# Patient Record
Sex: Male | Born: 1995 | Race: White | Hispanic: No | Marital: Single | Smoking: Current every day smoker
Health system: Southern US, Community
[De-identification: ages and names within clinical notes are randomized; demographics above are authoritative.]

## PROBLEM LIST (undated history)

## (undated) DIAGNOSIS — F419 Anxiety disorder, unspecified: Secondary | ICD-10-CM

## (undated) DIAGNOSIS — F1910 Other psychoactive substance abuse, uncomplicated: Secondary | ICD-10-CM

## (undated) DIAGNOSIS — J45909 Unspecified asthma, uncomplicated: Secondary | ICD-10-CM

## (undated) DIAGNOSIS — D689 Coagulation defect, unspecified: Secondary | ICD-10-CM

## (undated) HISTORY — PX: NO PAST SURGERIES: SHX2092

## (undated) HISTORY — DX: Coagulation defect, unspecified: D68.9

## (undated) HISTORY — DX: Other psychoactive substance abuse, uncomplicated: F19.10

---
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
## 2016-01-17 ENCOUNTER — Ambulatory Visit (INDEPENDENT_AMBULATORY_CARE_PROVIDER_SITE_OTHER): Payer: 59 | Admitting: Internal Medicine

## 2016-01-17 ENCOUNTER — Encounter: Payer: Self-pay | Admitting: Internal Medicine

## 2016-01-17 VITALS — BP 110/68 | HR 93 | Ht 71.0 in | Wt 131.8 lb

## 2016-01-17 DIAGNOSIS — J4550 Severe persistent asthma, uncomplicated: Secondary | ICD-10-CM | POA: Diagnosis not present

## 2016-01-17 DIAGNOSIS — J45909 Unspecified asthma, uncomplicated: Secondary | ICD-10-CM | POA: Insufficient documentation

## 2016-01-17 MED ORDER — MOMETASONE FURO-FORMOTEROL FUM 200-5 MCG/ACT IN AERO: 2.0000 | INHALATION_SPRAY | Freq: Two times a day (BID) | RESPIRATORY_TRACT | Status: DC

## 2016-01-17 NOTE — Patient Instructions (Signed)
Continue dulera twice daily Albuterol as needed STOP SMOKING ASAP  Asthma Attack Prevention While you may not be able to control the fact that you have asthma, you can take actions to prevent asthma attacks. The best way to prevent asthma attacks is to maintain good control of your asthma. You can achieve this by:  Taking your medicines as directed.  Avoiding things that can irritate your airways or make your asthma symptoms worse (asthma triggers).  Keeping track of how well your asthma is controlled and of any changes in your symptoms.  Responding quickly to worsening asthma symptoms (asthma attack).  Seeking emergency care when it is needed. WHAT ARE SOME WAYS TO PREVENT AN ASTHMA ATTACK? Have a Plan Work with your health care provider to create a written plan for managing and treating your asthma attacks (asthma action plan). This plan includes:  A list of your asthma triggers and how you can avoid them.  Information on when medicines should be taken and when their dosages should be changed.  The use of a device that measures how well your lungs are working (peak flow meter). Monitor Your Asthma Use your peak flow meter and record your results in a journal every day. A drop in your peak flow numbers on one or more days may indicate the start of an asthma attack. This can happen even before you start to feel symptoms. You can prevent an asthma attack from getting worse by following the steps in your asthma action plan. Avoid Asthma Triggers Work with your asthma health care provider to find out what your asthma triggers are. This can be done by:  Allergy testing.  Keeping a journal that notes when asthma attacks occur and the factors that may have contributed to them.  Determining if there are other medical conditions that are making your asthma worse. Once you have determined your asthma triggers, take steps to avoid them. This may include avoiding excessive or prolonged  exposure to:  Dust. Have someone dust and vacuum your home for you once or twice a week. Using a high-efficiency particulate arrestance (HEPA) vacuum is best.  Smoke. This includes campfire smoke, forest fire smoke, and secondhand smoke from tobacco products.  Pet dander. Avoid contact with animals that you know you are allergic to.  Allergens from trees, grasses or pollens. Avoid spending a lot of time outdoors when pollen counts are high, and on very windy days.  Very cold, dry, or humid air.  Mold.  Foods that contain high amounts of sulfites.  Strong odors.  Outdoor air pollutants, such as Museum/gallery exhibitions officerengine exhaust.  Indoor air pollutants, such as aerosol sprays and fumes from household cleaners.  Household pests, including dust mites and cockroaches, and pest droppings.  Certain medicines, including NSAIDs. Always talk to your health care provider before stopping or starting any new medicines. Medicines Take over-the-counter and prescription medicines only as told by your health care provider. Many asthma attacks can be prevented by carefully following your medicine schedule. Taking your medicines correctly is especially important when you cannot avoid certain asthma triggers. Act Quickly If an asthma attack does happen, acting quickly can decrease how severe it is and how long it lasts. Take these steps:   Pay attention to your symptoms. If you are coughing, wheezing, or having difficulty breathing, do not wait to see if your symptoms go away on their own. Follow your asthma action plan.  If you have followed your asthma action plan and your symptoms are not  improving, call your health care provider or seek immediate medical care at the nearest hospital. It is important to note how often you need to use your fast-acting rescue inhaler. If you are using your rescue inhaler more often, it may mean that your asthma is not under control. Adjusting your asthma treatment plan may help you to  prevent future asthma attacks and help you to gain better control of your condition. HOW CAN I PREVENT AN ASTHMA ATTACK WHEN I EXERCISE? Follow advice from your health care provider about whether you should use your fast-acting inhaler before exercising. Many people with asthma experience exercise-induced bronchoconstriction (EIB). This condition often worsens during vigorous exercise in cold, humid, or dry environments. Usually, people with EIB can stay very active by pre-treating with a fast-acting inhaler before exercising.   This information is not intended to replace advice given to you by your health care provider. Make sure you discuss any questions you have with your health care provider.   Document Released: 06/05/2009 Document Revised: 03/08/2015 Document Reviewed: 11/17/2014 Elsevier Interactive Patient Education [employer]

## 2016-01-17 NOTE — Progress Notes (Signed)
*   [facility]     Assessment and Plan: 20 yo white male with persistent ASTHMA due to polysubstance abuse and ongoing tobacco abuse  Severe asthma-persisitant - likely due to a combination of influenza, smoking, drug abuse. - discussed the importance of smoking cessation -will need to continue him on Dulera 2 puffs twice daily   Nicotine abuse. -We discussed the importance of smoking cessation to his overall health.  Drug abuse. -Discussed the importance of stopping drug abuse, I did not name drugs involved in his polysubstance abuse.  Date: 01/17/2016  MRN# [id number] George Becker January 07, 1996   George Becker is a 20 y.o. old male seen in follow up for chief complaint of  Chief Complaint  Patient presents with  . Follow-up    breathing has improved. no new concerns.      HPI:   Has been using albuterol 10 times per week, still smokes Noncompliant with dulera  No signs of infection at this time     Medication:   Outpatient Encounter Prescriptions as of 01/17/2016  Medication Sig  . albuterol (PROVENTIL HFA;VENTOLIN HFA) 108 (90 Base) MCG/ACT inhaler Inhale 2 puffs into the lungs every 6 (six) hours as needed for wheezing or shortness of breath.  . fexofenadine (ALLEGRA ALLERGY) 180 MG tablet Take 180 mg by mouth daily.  . mometasone-formoterol (DULERA) 200-5 MCG/ACT AERO Inhale 2 puffs into the lungs 2 (two) times daily.  . sertraline (ZOLOFT) 100 MG tablet Take 150 mg by mouth daily.  . [DISCONTINUED] lisdexamfetamine (VYVANSE) 60 MG capsule Take 60 mg by mouth every morning. Reported on 01/17/2016   No facility-administered encounter medications on file as of 01/17/2016.     Allergies:  Amoxicillin and Augmentin  Review of Systems: Gen:  Denies  fever, sweats. HEENT: Denies blurred vision. Cvc:  No dizziness, chest pain or heaviness Resp:   Denies cough or sputum porduction. Gi: Denies swallowing difficulty, stomach pain. constipation,  bowel incontinence Gu:  Denies bladder incontinence, burning urine Ext:   No Joint pain, stiffness. Skin: No skin rash, easy bruising. Endoc:  No polyuria, polydipsia. Psych: No depression, insomnia. Other:  All other systems were reviewed and found to be negative other than what is mentioned in the HPI.   Physical Examination:   VS: BP 110/68 mmHg  Pulse 93  Ht 5\' 11"  (1.803 m)  Wt 131 lb 12.8 oz (59.784 kg)  BMI 18.39 kg/m2  SpO2 96%  General Appearance: No distress  Neuro:without focal findings,  speech normal,  HEENT: PERRLA, EOM intact. Pulmonary: normal breath sounds, No wheezing.   CardiovascularNormal S1,S2.  No m/r/g.   Abdomen: Benign, Soft, non-tender. Renal:  No costovertebral tenderness  GU:  Not performed at this time. Endoc: No evident thyromegaly, no signs of acromegaly. Skin:   warm, no rash. Extremities: normal, no cyanosis, clubbing.  Follow up in 3 months  The Patient requires high complexity decision making for assessment and support, frequent evaluation and titration of therapies.  Patient/Family are satisfied with Plan of action and management. All questions answered   Lucie LeatherKurian David Idonia Zollinger, M.D.  [facility]  Medical Director [facility] Medical Director [facility]

## 2016-02-26 ENCOUNTER — Ambulatory Visit (HOSPITAL_COMMUNITY): Payer: Managed Care, Other (non HMO) | Admitting: Psychiatry

## 2016-04-12 ENCOUNTER — Ambulatory Visit (HOSPITAL_COMMUNITY): Payer: 59 | Admitting: Psychiatry

## 2016-06-06 ENCOUNTER — Ambulatory Visit (INDEPENDENT_AMBULATORY_CARE_PROVIDER_SITE_OTHER): Payer: 59 | Admitting: Internal Medicine

## 2016-06-06 ENCOUNTER — Encounter: Payer: Self-pay | Admitting: Internal Medicine

## 2016-06-06 VITALS — BP 118/68 | HR 69 | Ht 71.0 in | Wt 155.6 lb

## 2016-06-06 DIAGNOSIS — J4550 Severe persistent asthma, uncomplicated: Secondary | ICD-10-CM | POA: Diagnosis not present

## 2016-06-06 MED ORDER — FLUTICASONE-SALMETEROL 250-50 MCG/DOSE IN AEPB: 1.0000 | INHALATION_SPRAY | Freq: Two times a day (BID) | RESPIRATORY_TRACT | 0 refills | Status: DC

## 2016-06-06 MED ORDER — PREDNISONE 20 MG PO TABS: 20.0000 mg | ORAL_TABLET | Freq: Every day | ORAL | 1 refills | Status: DC

## 2016-06-06 NOTE — Patient Instructions (Signed)
START ADVAIR ALBUTEROL AS NEEDED SMOKING CESSATION STRONGLY ADVISED

## 2016-06-06 NOTE — Progress Notes (Signed)
*   [facility]   Date: 06/06/2016  MRN# [id number] Karoline Caldwellustin Slaugh 1996/05/31   Karoline Caldwellustin Dedeaux is a 20 y.o. old male seen in follow up for chief complaint of  Chief Complaint  Patient presents with  . Follow-up    43mo f/u. pt had ED visit 05-17-16 was given prednisone. pt c/o sob with exertion, dry cough at times prod with clear to yellowish mucus & wheezing    CC: follow up asthma  HPI:  Follow up ASTHMA Has been using albuterol 10 times per week, still smokes Noncompliant with dulera Has persistent wheezing Stopped using drigs  No signs of infection at this time     Medication:   Outpatient Encounter Prescriptions as of 06/06/2016  Medication Sig  . albuterol (PROVENTIL HFA;VENTOLIN HFA) 108 (90 Base) MCG/ACT inhaler Inhale 2 puffs into the lungs every 6 (six) hours as needed for wheezing or shortness of breath.  . fexofenadine (ALLEGRA ALLERGY) 180 MG tablet Take 180 mg by mouth daily.  . sertraline (ZOLOFT) 100 MG tablet Take 150 mg by mouth daily.  . [DISCONTINUED] mometasone-formoterol (DULERA) 200-5 MCG/ACT AERO Inhale 2 puffs into the lungs 2 (two) times daily. (Patient not taking: Reported on 06/06/2016)   No facility-administered encounter medications on file as of 06/06/2016.      Allergies:  Amoxicillin and Augmentin [amoxicillin-pot clavulanate]  Review of Systems: Gen:  Denies  fever, sweats. HEENT: Denies blurred vision. Cvc:  No dizziness, chest pain or heaviness Resp:   Denies cough or sputum porduction. Gi: Denies swallowing difficulty, stomach pain. constipation, bowel incontinence Gu:  Denies bladder incontinence, burning urine Ext:   No Joint pain, stiffness. Skin: No skin rash, easy bruising. Endoc:  No polyuria, polydipsia. Psych: No depression, insomnia. Other:  All other systems were reviewed and found to be negative other than what is mentioned in the HPI.   Physical Examination:   VS: BP 118/68 (BP Location: Left Arm,  Cuff Size: Normal)   Pulse 69   Ht 5\' 11"  (1.803 m)   Wt 155 lb 9.6 oz (70.6 kg)   SpO2 98%   BMI 21.70 kg/m   General Appearance: No distress  Neuro:without focal findings,  speech normal,  HEENT: PERRLA, EOM intact. Pulmonary: normal breath sounds,+wheezing.   CardiovascularNormal S1,S2.  No m/r/g.   Extremities: normal, no cyanosis, clubbing.     Assessment and Plan: 20 yo white male with persistent ASTHMA due to polysubstance abuse and ongoing tobacco abuse Patient with chronic persistent wheezing  Severe asthma-persisitant - discussed the importance of smoking cessation -will need Advair daily -albuterol as needed -prednisone 40 mg daily for 10 days   Nicotine abuse. -We discussed the importance of smoking cessation to his overall health.    Follow up in 3 months  The Patient requires high complexity decision making for assessment and support, frequent evaluation and titration of therapies.  Patient/Family are satisfied with Plan of action and management. All questions answered   Lucie LeatherKurian David Kealey Kemmer, M.D.  [facility]  Medical Director [facility] Medical Director [facility]

## 2016-10-03 ENCOUNTER — Emergency Department (INDEPENDENT_AMBULATORY_CARE_PROVIDER_SITE_OTHER)
Admission: EM | Admit: 2016-10-03 | Discharge: 2016-10-03 | Disposition: A | Discharge status: Home / Self Care | Payer: Managed Care, Other (non HMO) | Source: Home / Self Care | Attending: Family Medicine | Admitting: Family Medicine

## 2016-10-03 ENCOUNTER — Encounter: Payer: Self-pay | Admitting: Emergency Medicine

## 2016-10-03 ENCOUNTER — Telehealth: Payer: Self-pay | Admitting: Emergency Medicine

## 2016-10-03 DIAGNOSIS — R5383 Other fatigue: Secondary | ICD-10-CM | POA: Diagnosis not present

## 2016-10-03 DIAGNOSIS — R112 Nausea with vomiting, unspecified: Secondary | ICD-10-CM

## 2016-10-03 MED ORDER — ONDANSETRON 4 MG PO TBDP
4.0000 mg | ORAL_TABLET | Freq: Once | ORAL | Status: AC
  Administered 2016-10-03: 4 mg via ORAL

## 2016-10-03 MED ORDER — ONDANSETRON 4 MG PO TBDP: ORAL_TABLET | ORAL | 0 refills | Status: DC

## 2016-10-03 NOTE — ED Provider Notes (Signed)
[facility]    CSN: [id number] Arrival date & time: 10/03/16  1920     History   Chief Complaint Chief Complaint  Patient presents with  . Nausea    HPI George Becker is a 21 y.o. male.   Patient reports that he had an episode of nausea/vomiting about 2 weeks ago that resolved.  The symptoms recurred briefly three days ago, again resolved.  Bowel movements have been normal.  No abdominal pain.  He complains of persistent fatigue.  No fevers, chills, and sweats.  No weight loss. He requests STD testing   The history is provided by the patient.    Past Medical History:  Diagnosis Date  . Anxiety   . Asthma     Patient Active Problem List   Diagnosis Date Noted  . Asthma 01/17/2016  . Asthma exacerbation 10/04/2015  . Polysubstance abuse 10/04/2015    Past Surgical History:  Procedure Laterality Date  . NO PAST SURGERIES         Home Medications    Prior to Admission medications   Medication Sig Start Date End Date Taking? Authorizing Provider  albuterol (PROVENTIL HFA;VENTOLIN HFA) 108 (90 Base) MCG/ACT inhaler Inhale 2 puffs into the lungs every 6 (six) hours as needed for wheezing or shortness of breath. 10/05/15   Enedina Finner, MD  fexofenadine [facility] ALLERGY) 180 MG tablet Take 180 mg by mouth daily.    Historical Provider, MD  Fluticasone-Salmeterol (ADVAIR DISKUS) 250-50 MCG/DOSE AEPB Inhale 1 puff into the lungs 2 (two) times daily. 06/06/16 06/06/17  Erin Fulling, MD  ondansetron (ZOFRAN ODT) 4 MG disintegrating tablet Take one tab by mouth Q6hr prn nausea.  Dissolve under tongue. 10/03/16   Lattie Haw, MD    Family History Family History  Problem Relation Age of Onset  . Diabetes Mother   . Hypertension Father   . Asthma Maternal Grandfather     Social History Social History  Substance Use Topics  . Smoking status: Current Every Day Smoker    Packs/day: 1.00    Types: Cigarettes  . Smokeless tobacco: Never Used  . Alcohol use  0.0 oz/week     Allergies   Amoxicillin and Augmentin [amoxicillin-pot clavulanate]   Review of Systems Review of Systems No sore throat No cough No pleuritic pain No wheezing No nasal congestion No post-nasal drainage No sinus pain/pressure No itchy/red eyes No earache No hemoptysis No SOB No fever/chills + nausea + vomiting (resolved) No abdominal pain No diarrhea No urinary symptoms No skin rash + fatigue No myalgias No headache Used OTC meds without relief   Physical Exam Triage Vital Signs ED Triage Vitals [10/03/16 2001]  Enc Vitals Group     BP 107/69     Pulse Rate 82     Resp      Temp 97.9 F (36.6 C)     Temp Source Oral     SpO2 97 %     Weight 148 lb (67.1 kg)     Height  (1.803 m)     Head Circumference      Peak Flow      Pain Score 0     Pain Loc      Pain Edu?      Excl. in GC?    No data found.   Updated Vital Signs BP 107/69 (BP Location: Left Arm)   Pulse 82   Temp 97.9 F (36.6 C) (Oral)   Ht  (1.803  m)   Wt 148 lb (67.1 kg)   SpO2 97%   BMI 20.64 kg/m   Visual Acuity Right Eye Distance:   Left Eye Distance:   Bilateral Distance:    Right Eye Near:   Left Eye Near:    Bilateral Near:     Physical Exam Nursing notes and Vital Signs reviewed. Appearance:  Patient appears stated age, and in no acute distress Eyes:  Pupils are equal, round, and reactive to light and accomodation.  Extraocular movement is intact.  Conjunctivae are not inflamed  Ears:  Canals normal.  Tympanic membranes normal.  Nose: Normal turbinates.  No sinus tenderness.   Pharynx:  Normal Neck:  Supple.  No adenopathy or thyromegaly. Lungs:  Clear to auscultation.  Breath sounds are equal.  Moving air well. Heart:  Regular rate and rhythm without murmurs, rubs, or gallops.  Abdomen:  Nontender without masses or hepatosplenomegaly.  Bowel sounds are present.  No CVA or flank tenderness.  Extremities:  No edema.  Skin:  No rash  present.    UC Treatments / Results  Labs (all labs ordered are listed, but only abnormal results are displayed) Labs Reviewed  GC/CHLAMYDIA PROBE AMP  CBC WITH DIFFERENTIAL/PLATELET  COMPLETE METABOLIC PANEL WITH GFR  TSH  HIV ANTIBODY (ROUTINE TESTING)  RPR  HSV(HERPES SIMPLEX VRS) I + II AB-IGG    EKG  EKG Interpretation None       Radiology No results found.  Procedures Procedures (including critical care time)  Medications Ordered in UC Medications - No data to display   Initial Impression / Assessment and Plan / UC Course  I have reviewed the triage vital signs and the nursing notes.  Pertinent labs & imaging results that were available during my care of the patient were reviewed by me and considered in my medical decision making (see chart for details).    Administered Zofran ODT  PO; given Rx for same. CMP, CBC, TSH pending Screen for STD at patient's request. Followup with Family Doctor.   Final Clinical Impressions(s) / UC Diagnoses   Final diagnoses:  Other fatigue  Non-intractable vomiting with nausea, unspecified vomiting type    New Prescriptions New Prescriptions   ONDANSETRON (ZOFRAN ODT) 4 MG DISINTEGRATING TABLET    Take one tab by mouth Q6hr prn nausea.  Dissolve under tongue.     Lattie Haw, MD 10/07/16 [phone]

## 2016-10-03 NOTE — Telephone Encounter (Signed)
Cheyne will be traveling next week and has given permission to give lab results and any medical information to his mother

## 2016-10-03 NOTE — ED Triage Notes (Signed)
Nausea, vomiting, chills x 3 days, currently resolved

## 2016-10-04 LAB — CBC WITH DIFFERENTIAL/PLATELET
Basophils Absolute: 0 cells/uL (ref 0–200)
Basophils Relative: 0 %
Eosinophils Absolute: 570 cells/uL — ABNORMAL HIGH (ref 15–500)
Eosinophils Relative: 6 %
HEMATOCRIT: 44.5 % (ref 38.5–50.0)
Hemoglobin: 15.1 g/dL (ref 13.2–17.1)
LYMPHS PCT: 23 %
Lymphs Abs: 2185 cells/uL (ref 850–3900)
MCH: 29.7 pg (ref 27.0–33.0)
MCHC: 33.9 g/dL (ref 32.0–36.0)
MCV: 87.6 fL (ref 80.0–100.0)
MONO ABS: 475 {cells}/uL (ref 200–950)
MPV: 9.4 fL (ref 7.5–12.5)
Monocytes Relative: 5 %
NEUTROS PCT: 66 %
Neutro Abs: 6270 cells/uL (ref 1500–7800)
Platelets: 232 10*3/uL (ref 140–400)
RBC: 5.08 MIL/uL (ref 4.20–5.80)
RDW: 13.6 % (ref 11.0–15.0)
WBC: 9.5 10*3/uL (ref 3.8–10.8)

## 2016-10-04 LAB — COMPLETE METABOLIC PANEL WITH GFR
ALT: 8 U/L (ref 8–46)
AST: 11 U/L — ABNORMAL LOW (ref 12–32)
Albumin: 4.7 g/dL (ref 3.6–5.1)
Alkaline Phosphatase: 45 U/L — ABNORMAL LOW (ref 48–230)
BUN: 8 mg/dL (ref 7–20)
CALCIUM: 9.2 mg/dL (ref 8.9–10.4)
CO2: 27 mmol/L (ref 20–31)
Chloride: 103 mmol/L (ref 98–110)
Creat: 0.67 mg/dL (ref 0.60–1.26)
GFR, Est Non African American: >89 mL/min (ref >=60)
Glucose, Bld: 93 mg/dL (ref 65–99)
POTASSIUM: 3.7 mmol/L — AB (ref 3.8–5.1)
Sodium: 140 mmol/L (ref 135–146)
Total Bilirubin: 0.7 mg/dL (ref 0.2–1.1)
Total Protein: 7.1 g/dL (ref 6.3–8.2)

## 2016-10-04 LAB — TSH: TSH: 0.93 m[IU]/L (ref 0.50–4.30)

## 2016-10-04 LAB — HIV ANTIBODY (ROUTINE TESTING W REFLEX): HIV: NONREACTIVE

## 2016-10-05 LAB — RPR

## 2016-10-05 LAB — GC/CHLAMYDIA PROBE AMP
CT Probe RNA: NOT DETECTED
GC Probe RNA: NOT DETECTED

## 2016-10-07 ENCOUNTER — Telehealth: Payer: Self-pay | Admitting: *Deleted

## 2016-10-07 ENCOUNTER — Telehealth: Payer: Self-pay | Admitting: Emergency Medicine

## 2016-10-07 LAB — HSV(HERPES SIMPLEX VRS) I + II AB-IGG: HSV 1 GLYCOPROTEIN G AB, IGG: 25.7 {index} — AB (ref <0.90)

## 2016-10-07 NOTE — Telephone Encounter (Signed)
Spoke to pt's mother given lab results per pt's request.

## 2017-03-15 ENCOUNTER — Emergency Department (HOSPITAL_COMMUNITY)
Admission: EM | Admit: 2017-03-15 | Discharge: 2017-03-17 | Disposition: A | Discharge status: Home / Self Care | Payer: Managed Care, Other (non HMO) | Attending: Emergency Medicine | Admitting: Emergency Medicine

## 2017-03-15 ENCOUNTER — Encounter (HOSPITAL_COMMUNITY): Payer: Self-pay | Admitting: Emergency Medicine

## 2017-03-15 DIAGNOSIS — Z593 Problems related to living in residential institution: Secondary | ICD-10-CM | POA: Diagnosis not present

## 2017-03-15 DIAGNOSIS — Z79899 Other long term (current) drug therapy: Secondary | ICD-10-CM | POA: Insufficient documentation

## 2017-03-15 DIAGNOSIS — F1910 Other psychoactive substance abuse, uncomplicated: Secondary | ICD-10-CM | POA: Diagnosis present

## 2017-03-15 DIAGNOSIS — Z720 Tobacco use: Secondary | ICD-10-CM

## 2017-03-15 DIAGNOSIS — E876 Hypokalemia: Secondary | ICD-10-CM

## 2017-03-15 DIAGNOSIS — R45851 Suicidal ideations: Secondary | ICD-10-CM

## 2017-03-15 DIAGNOSIS — F1994 Other psychoactive substance use, unspecified with psychoactive substance-induced mood disorder: Secondary | ICD-10-CM | POA: Diagnosis present

## 2017-03-15 DIAGNOSIS — F17210 Nicotine dependence, cigarettes, uncomplicated: Secondary | ICD-10-CM | POA: Diagnosis not present

## 2017-03-15 DIAGNOSIS — Z008 Encounter for other general examination: Secondary | ICD-10-CM

## 2017-03-15 DIAGNOSIS — G4700 Insomnia, unspecified: Secondary | ICD-10-CM

## 2017-03-15 DIAGNOSIS — R443 Hallucinations, unspecified: Secondary | ICD-10-CM | POA: Insufficient documentation

## 2017-03-15 DIAGNOSIS — J4520 Mild intermittent asthma, uncomplicated: Secondary | ICD-10-CM | POA: Diagnosis not present

## 2017-03-15 LAB — CBC
HCT: 45.3 % (ref 39.0–52.0)
Hemoglobin: 16.3 g/dL (ref 13.0–17.0)
MCH: 30.8 pg (ref 26.0–34.0)
MCHC: 36 g/dL (ref 30.0–36.0)
MCV: 85.6 fL (ref 78.0–100.0)
PLATELETS: 265 10*3/uL (ref 150–400)
RBC: 5.29 MIL/uL (ref 4.22–5.81)
RDW: 12.6 % (ref 11.5–15.5)
WBC: 8.3 10*3/uL (ref 4.0–10.5)

## 2017-03-15 LAB — RAPID URINE DRUG SCREEN, HOSP PERFORMED
AMPHETAMINES: POSITIVE — AB
BENZODIAZEPINES: NOT DETECTED
Barbiturates: NOT DETECTED
COCAINE: NOT DETECTED
OPIATES: POSITIVE — AB
Tetrahydrocannabinol: POSITIVE — AB

## 2017-03-15 LAB — COMPREHENSIVE METABOLIC PANEL
ALK PHOS: 45 U/L (ref 38–126)
ALT: 16 U/L — AB (ref 17–63)
AST: 19 U/L (ref 15–41)
Albumin: 4.9 g/dL (ref 3.5–5.0)
Anion gap: 15 (ref 5–15)
BILIRUBIN TOTAL: 1.6 mg/dL — AB (ref 0.3–1.2)
BUN: 11 mg/dL (ref 6–20)
CO2: 28 mmol/L (ref 22–32)
CREATININE: 0.77 mg/dL (ref 0.61–1.24)
Calcium: 9.3 mg/dL (ref 8.9–10.3)
Chloride: 95 mmol/L — ABNORMAL LOW (ref 101–111)
GFR calc Af Amer: >60 mL/min (ref >60)
Glucose, Bld: 91 mg/dL (ref 65–99)
Potassium: 3.3 mmol/L — ABNORMAL LOW (ref 3.5–5.1)
Sodium: 138 mmol/L (ref 135–145)
TOTAL PROTEIN: 7.9 g/dL (ref 6.5–8.1)

## 2017-03-15 LAB — ETHANOL

## 2017-03-15 LAB — SALICYLATE LEVEL: Salicylate Lvl: <7 mg/dL (ref 2.8–30.0)

## 2017-03-15 LAB — ACETAMINOPHEN LEVEL: Acetaminophen (Tylenol), Serum: <10 ug/mL — ABNORMAL LOW (ref 10–30)

## 2017-03-15 NOTE — ED Notes (Signed)
MD notified of patient in triage 3

## 2017-03-15 NOTE — ED Notes (Signed)
Patient gave mother permission to get info George Becker [phone].

## 2017-03-15 NOTE — ED Provider Notes (Signed)
[facility]-EMER[facility]ENCY DEPT Provider Note   CSN: [id number] Arrival date & time: 03/15/17  2214     History   Chief Complaint Chief Complaint  Patient presents with  . Suicidal    HPI George Becker is a 21 y.o. male with a PMHx of asthma, anxiety, and polysubstance abuse, who presents to the ED with complaints of suicidal ideations without a plan and polysubstance abuse. Patient states that he was sober until about one month ago when he relapsed. He uses multiple drugs, but most consistently smokes meth and uses heroin. He used heroin just prior to arrival today, and his last meth use was at 2 AM this morning. He occasionally uses marijuana but hasn't had any recently. He is very upset that he relapsed, and this is contributing to his suicidal thoughts. He reports body aches and nausea due to heroin use, as well as insomnia stating that he has not slept in 6 days. He is having visual hallucinations seeing bubbling at the ceiling tiles, and having auditory hallucinations hearing whispers but denies any command voices. Denies EtOH use or HI. +Smoker. Reports having asthma, has had some wheezing today, but his inhaler helped significantly in the evaluation room. Sees Dr. Daleen Squibb in [place] for his psychiatric care. Reports being rx'd zoloft  daily but hasn't taken it in about 3 weeks. Denies any other complaints at this time. Here voluntarily.    The history is provided by the patient and medical records. No language interpreter was used.  Mental Health Problem  Presenting symptoms: depression, hallucinations and suicidal thoughts   Patient accompanied by:  Family member Onset quality:  Gradual Duration:  1 month Timing:  Constant Progression:  Worsening Chronicity:  Recurrent Context: drug abuse and noncompliance   Treatment compliance:  Some of the time Time since last psychoactive medication taken:  3 weeks Relieved by:  None tried Worsened by:  Drugs Ineffective treatments:  None  tried Associated symptoms: no abdominal pain and no chest pain   Risk factors: hx of mental illness     Past Medical History:  Diagnosis Date  . Anxiety   . Asthma     Patient Active Problem List   Diagnosis Date Noted  . Asthma 01/17/2016  . Asthma exacerbation 10/04/2015  . Polysubstance abuse 10/04/2015    Past Surgical History:  Procedure Laterality Date  . NO PAST SURGERIES         Home Medications    Prior to Admission medications   Medication Sig Start Date End Date Taking? Authorizing Provider  albuterol (PROVENTIL HFA;VENTOLIN HFA) 108 (90 Base) MCG/ACT inhaler Inhale 2 puffs into the lungs every 6 (six) hours as needed for wheezing or shortness of breath. 10/05/15   Enedina Finner, MD  fexofenadine [facility] ALLERGY) 180 MG tablet Take 180 mg by mouth daily.    [provider]  Fluticasone-Salmeterol (ADVAIR DISKUS) 250-50 MCG/DOSE AEPB Inhale 1 puff into the lungs 2 (two) times daily. 06/06/16 06/06/17  Erin Fulling, MD  ondansetron (ZOFRAN ODT) 4 MG disintegrating tablet Take one tab by mouth Q6hr prn nausea.  Dissolve under tongue. 10/03/16   Lattie Haw, MD    Family History Family History  Problem Relation Age of Onset  . Diabetes Mother   . Hypertension Father   . Asthma Maternal Grandfather     Social History Social History  Substance Use Topics  . Smoking status: Current Every Day Smoker    Packs/day: 1.00    Types: Cigarettes  . Smokeless tobacco:  Never Used  . Alcohol use 0.0 oz/week     Allergies   Amoxicillin and Augmentin [amoxicillin-pot clavulanate]   Review of Systems Review of Systems  Constitutional: Negative for chills and fever.  Respiratory: Positive for wheezing. Negative for shortness of breath.   Cardiovascular: Negative for chest pain.  Gastrointestinal: Positive for nausea. Negative for abdominal pain, constipation, diarrhea and vomiting.  Genitourinary: Negative for dysuria and hematuria.  Musculoskeletal:  Positive for myalgias (body aches). Negative for arthralgias.  Skin: Negative for color change.  Allergic/Immunologic: Negative for immunocompromised state.  Neurological: Negative for weakness and numbness.  Psychiatric/Behavioral: Positive for hallucinations, sleep disturbance and suicidal ideas. Negative for confusion.   All other systems reviewed and are negative for acute change except as noted in the HPI.    Physical Exam Updated Vital Signs BP 129/82 (BP Location: Left Arm)   Pulse (!) 105   Temp (!) 97.5 F (36.4 C) (Oral)   Resp 18   Ht  (1.778 m)   Wt 67.1 kg (148 lb)   SpO2 95%   BMI 21.24 kg/m   Physical Exam  Constitutional: He is oriented to person, place, and time. Vital signs are normal. He appears well-developed and well-nourished.  Non-toxic appearance. No distress.  Afebrile, nontoxic, NAD  HENT:  Head: Normocephalic and atraumatic.  Mouth/Throat: Oropharynx is clear and moist and mucous membranes are normal.  Eyes: Conjunctivae and EOM are normal. Right eye exhibits no discharge. Left eye exhibits no discharge.  Neck: Normal range of motion. Neck supple.  Cardiovascular: Normal rate, regular rhythm, normal heart sounds and intact distal pulses.  Exam reveals no gallop and no friction rub.   No murmur heard. Pulmonary/Chest: Effort normal and breath sounds normal. No respiratory distress. He has no decreased breath sounds. He has no wheezes. He has no rhonchi. He has no rales.  Initially with some expiratory wheezing, but after albuterol CTAB in all lung fields, no w/r/r, no hypoxia or increased WOB, speaking in full sentences, SpO2 95% on RA   Abdominal: Soft. Normal appearance and bowel sounds are normal. He exhibits no distension. There is no tenderness. There is no rigidity, no rebound, no guarding, no CVA tenderness, no tenderness at McBurney's point and negative Murphy's sign.  Musculoskeletal: Normal range of motion.  Neurological: He is alert and  oriented to person, place, and time. He has normal strength. No sensory deficit.  Skin: Skin is warm, dry and intact. No rash noted.  Psychiatric: He is actively hallucinating. He exhibits a depressed mood. He expresses suicidal ideation. He expresses no homicidal ideation. He expresses no suicidal plans and no homicidal plans.  Slightly depressed affect, tearful at times, but pleasant and cooperative. Endorsing SI without a plan, denies HI, reports AVH, but doesn't seem to be responding to internal stimuli.   Nursing note and vitals reviewed.    ED Treatments / Results  Labs (all labs ordered are listed, but only abnormal results are displayed) Labs Reviewed  COMPREHENSIVE METABOLIC PANEL - Abnormal; Notable for the following:       Result Value   Potassium 3.3 (*)    Chloride 95 (*)    ALT 16 (*)    Total Bilirubin 1.6 (*)    All other components within normal limits  ACETAMINOPHEN LEVEL - Abnormal; Notable for the following:    Acetaminophen (Tylenol), Serum <10 (*)    All other components within normal limits  RAPID URINE DRUG SCREEN, HOSP PERFORMED - Abnormal; Notable for the  following:    Opiates POSITIVE (*)    Amphetamines POSITIVE (*)    Tetrahydrocannabinol POSITIVE (*)    All other components within normal limits  ETHANOL  SALICYLATE LEVEL  CBC    EKG  EKG Interpretation None       Radiology No results found.  Procedures Procedures (including critical care time)  Medications Ordered in ED Medications  loratadine (CLARITIN) tablet 10 mg (not administered)  fluticasone furoate-vilanterol (BREO ELLIPTA) 200-25 MCG/INH 1 puff (not administered)  albuterol (PROVENTIL HFA;VENTOLIN HFA) 108 (90 Base) MCG/ACT inhaler 2 puff (not administered)  sertraline (ZOLOFT) tablet 100 mg (not administered)  triamcinolone cream (KENALOG) 0.1 % (not administered)  valACYclovir (VALTREX) tablet 1,000 mg (not administered)  potassium chloride SA (K-DUR,KLOR-CON) CR tablet 40  mEq (not administered)  acetaminophen (TYLENOL) tablet 650 mg (not administered)  zolpidem (AMBIEN) tablet 5 mg (not administered)  ondansetron (ZOFRAN) tablet 4 mg (not administered)  alum & mag hydroxide-simeth (MAALOX/MYLANTA) 200-200-20 MG/5ML suspension 30 mL (not administered)  nicotine (NICODERM CQ - dosed in mg/24 hours) patch 21 mg (not administered)     Initial Impression / Assessment and Plan / ED Course  I have reviewed the triage vital signs and the nursing notes.  Pertinent labs & imaging results that were available during my care of the patient were reviewed by me and considered in my medical decision making (see chart for details).     21 y.o. male here with SI without a plan, and polysubstance abuse. Reports insomnia and hallucinations related to drug use. +Smoker, cessation strongly encouraged. Denies HI/EtOH. Only other complaints is nausea and body aches from heroin use. Also has wheeze from asthma, used inhaler in room and this significantly improved. Noncompliant with zoloft x3wks. On exam, tearful but cooperative and very pleasant, lungs clear after albuterol, depressed affect. Labs with mildly low K 3.3, mildly elevated bili 1.6 likely from dehydration, UDS with +opiates +amphetamines +THC but otherwise work up unremarkable. Pt medically cleared at this time. Psych hold orders and home med orders placed. Please see TTS notes for further documentation of care/dispo. PLEASE NOTE THAT PT IS HERE VOLUNTARILY AT THIS TIME, IF PT TRIES TO LEAVE THEY WOULD NEED IVC PAPERWORK TAKEN OUT. Pt stable at time of med clearance.     Final Clinical Impressions(s) / ED Diagnoses   Final diagnoses:  Suicidal ideation  Polysubstance abuse  Tobacco user  Hallucinations  Insomnia, unspecified type  Mild intermittent asthma without complication  Medical clearance for psychiatric admission  Hypokalemia    New Prescriptions New Prescriptions   No medications on [address]  [place], [place] 03/16/17 Mal Misty, April, MD 03/16/17 [phone]

## 2017-03-15 NOTE — ED Notes (Signed)
Bed: [facility] Expected date:  Expected time:  Means of arrival:  Comments: 
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
## 2017-03-16 DIAGNOSIS — Z593 Problems related to living in residential institution: Secondary | ICD-10-CM

## 2017-03-16 DIAGNOSIS — F17210 Nicotine dependence, cigarettes, uncomplicated: Secondary | ICD-10-CM | POA: Diagnosis not present

## 2017-03-16 DIAGNOSIS — F1994 Other psychoactive substance use, unspecified with psychoactive substance-induced mood disorder: Secondary | ICD-10-CM | POA: Diagnosis not present

## 2017-03-16 DIAGNOSIS — R443 Hallucinations, unspecified: Secondary | ICD-10-CM

## 2017-03-16 DIAGNOSIS — F1910 Other psychoactive substance abuse, uncomplicated: Secondary | ICD-10-CM | POA: Diagnosis not present

## 2017-03-16 DIAGNOSIS — F419 Anxiety disorder, unspecified: Secondary | ICD-10-CM

## 2017-03-16 DIAGNOSIS — R4587 Impulsiveness: Secondary | ICD-10-CM

## 2017-03-16 DIAGNOSIS — R451 Restlessness and agitation: Secondary | ICD-10-CM

## 2017-03-16 DIAGNOSIS — R45851 Suicidal ideations: Secondary | ICD-10-CM

## 2017-03-16 MED ORDER — ALBUTEROL SULFATE HFA 108 (90 BASE) MCG/ACT IN AERS
2.0000 | INHALATION_SPRAY | RESPIRATORY_TRACT | Status: DC | PRN
  Administered 2017-03-16: 2 via RESPIRATORY_TRACT
  Filled 2017-03-16: qty 6.7

## 2017-03-16 MED ORDER — ONDANSETRON HCL 4 MG PO TABS
4.0000 mg | ORAL_TABLET | Freq: Three times a day (TID) | ORAL | Status: DC | PRN
Start: 2017-03-16 — End: 2017-03-17

## 2017-03-16 MED ORDER — GABAPENTIN 100 MG PO CAPS
200.0000 mg | ORAL_CAPSULE | Freq: Two times a day (BID) | ORAL | Status: DC
  Administered 2017-03-16 – 2017-03-17 (×3): 200 mg via ORAL
  Filled 2017-03-16 (×3): qty 2

## 2017-03-16 MED ORDER — POTASSIUM CHLORIDE CRYS ER 20 MEQ PO TBCR
40.0000 meq | EXTENDED_RELEASE_TABLET | Freq: Once | ORAL | Status: AC
  Administered 2017-03-16: 40 meq via ORAL
  Filled 2017-03-16: qty 2

## 2017-03-16 MED ORDER — SERTRALINE HCL 50 MG PO TABS
100.0000 mg | ORAL_TABLET | Freq: Every day | ORAL | Status: DC
  Administered 2017-03-16: 100 mg via ORAL
  Filled 2017-03-16: qty 2

## 2017-03-16 MED ORDER — TRIAMCINOLONE ACETONIDE 0.1 % EX CREA
TOPICAL_CREAM | Freq: Two times a day (BID) | CUTANEOUS | Status: DC
  Administered 2017-03-16 (×2): via TOPICAL
  Filled 2017-03-16: qty 15

## 2017-03-16 MED ORDER — FLUTICASONE FUROATE-VILANTEROL 200-25 MCG/INH IN AEPB
1.0000 | INHALATION_SPRAY | Freq: Every day | RESPIRATORY_TRACT | Status: DC
  Administered 2017-03-16 – 2017-03-17 (×2): 1 via RESPIRATORY_TRACT
  Filled 2017-03-16: qty 28

## 2017-03-16 MED ORDER — NICOTINE 21 MG/24HR TD PT24
21.0000 mg | MEDICATED_PATCH | Freq: Every day | TRANSDERMAL | Status: DC
  Administered 2017-03-16 – 2017-03-17 (×3): 21 mg via TRANSDERMAL
  Filled 2017-03-16 (×3): qty 1

## 2017-03-16 MED ORDER — VALACYCLOVIR HCL 500 MG PO TABS: 1000.0000 mg | ORAL_TABLET | Freq: Three times a day (TID) | ORAL | Status: DC | PRN

## 2017-03-16 MED ORDER — ALUM & MAG HYDROXIDE-SIMETH 200-200-20 MG/5ML PO SUSP: 30.0000 mL | Freq: Four times a day (QID) | ORAL | Status: DC | PRN

## 2017-03-16 MED ORDER — ZOLPIDEM TARTRATE 5 MG PO TABS
5.0000 mg | ORAL_TABLET | Freq: Every evening | ORAL | Status: DC | PRN
  Administered 2017-03-16: 5 mg via ORAL
  Filled 2017-03-16: qty 1

## 2017-03-16 MED ORDER — ACETAMINOPHEN 325 MG PO TABS: 650.0000 mg | ORAL_TABLET | ORAL | Status: DC | PRN

## 2017-03-16 MED ORDER — LORATADINE 10 MG PO TABS
10.0000 mg | ORAL_TABLET | Freq: Every day | ORAL | Status: DC
  Administered 2017-03-16 – 2017-03-17 (×2): 10 mg via ORAL
  Filled 2017-03-16 (×2): qty 1

## 2017-03-16 MED ORDER — TRAZODONE HCL 50 MG PO TABS
50.0000 mg | ORAL_TABLET | Freq: Every day | ORAL | Status: DC
  Administered 2017-03-16: 50 mg via ORAL
  Filled 2017-03-16: qty 1

## 2017-03-16 NOTE — ED Notes (Signed)
Pt presents with SI, after relapsing on drugs.  Mother reports symptoms aggravated by recent death of family member.  A&O x 3, no distress noted, calm & cooperative.  Monitoring for safety, Q 15 min checks in effect.  Safety check for contraband completed, no items found.  TTS counselor Aquicha eval pt at present.

## 2017-03-16 NOTE — ED Notes (Signed)
Patient wanded 
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
## 2017-03-16 NOTE — ED Notes (Signed)
Patient has been using meth and heroin.  He states, "I have addiction problems."  Patient becomes suicidal when he is using and thinks of "walking into traffic or something."  Patient has not slept in 6 days and is very drowsy and difficult to wake.  He is pleasant and is requesting help for his substance abuse.  He met with treatment team and agreed to stay overnight and discuss options with peer support in the am.  Patient denies thoughts of self harm at this time.  He is visiting with his mother who is supportive.

## 2017-03-17 DIAGNOSIS — F1910 Other psychoactive substance abuse, uncomplicated: Secondary | ICD-10-CM | POA: Diagnosis not present

## 2017-03-17 MED ORDER — GABAPENTIN 100 MG PO CAPS: 200.0000 mg | ORAL_CAPSULE | Freq: Two times a day (BID) | ORAL | 0 refills | Status: DC

## 2017-03-17 MED ORDER — TRAZODONE HCL 50 MG PO TABS: 50.0000 mg | ORAL_TABLET | Freq: Every day | ORAL | 0 refills | Status: DC

## 2017-03-17 NOTE — ED Notes (Signed)
Peer Support Specialist at bedside.

## 2017-03-17 NOTE — [facility] Suicide Risk Assessment (Signed)
Suicide Risk Assessment  Discharge Assessment   [facility] Discharge Suicide Risk Assessment   Principal Problem: Substance induced mood disorder [facility]) Discharge Diagnoses:  Patient Active Problem List   Diagnosis Date Noted  . Substance induced mood disorder ([facility]) [F19.94] 03/16/2017  . Asthma [J45.909] 01/17/2016  . Asthma exacerbation [J45.901] 10/04/2015  . Polysubstance abuse [F19.10] 10/04/2015    Total Time spent with patient: 45 minutes  Musculoskeletal: Strength & Muscle Tone: within normal limits Gait & Station: normal Patient leans: N/A  Psychiatric Specialty Exam: Physical Exam  Constitutional: He is oriented to person, place, and time. He appears well-developed and well-nourished.  Respiratory: Effort normal.  Musculoskeletal: Normal range of motion.  Neurological: He is alert and oriented to person, place, and time.  Psychiatric: His speech is normal and behavior is normal. Thought content normal. His mood appears anxious. Cognition and memory are impaired. He expresses impulsivity. He exhibits a depressed mood.   Review of Systems  Psychiatric/Behavioral: Positive for depression and substance abuse. Negative for hallucinations, memory loss and suicidal ideas. The patient is nervous/anxious. The patient does not have insomnia.   All other systems reviewed and are negative.  Blood pressure 122/64, pulse 82, temperature 97.8 F (36.6 C), temperature source Oral, resp. rate 20, height  (1.778 m), weight 67.1 kg (148 lb), SpO2 99 %.Body mass index is 21.24 kg/m. General Appearance: Casual Eye Contact:  Good Speech:  Clear and Coherent and Normal Rate Volume:  Normal Mood:  Anxious and Depressed Affect:  Congruent and Depressed Thought Process:  Coherent, Goal Directed and Linear Orientation:  Full (Time, Place, and Person) Thought Content:  Logical Suicidal Thoughts:  No Homicidal Thoughts:  No Memory:  Immediate;   Good Recent;   Good Remote;    Fair Judgement:  Fair Insight:  Fair Psychomotor Activity:  Normal Concentration:  Concentration: Good and Attention Span: Good Recall:  Good Fund of Knowledge:  Good Language:  Good Akathisia:  No Handed:  Right AIMS (if indicated):    Assets:  Communication Skills Desire for Improvement Financial Resources/Insurance Housing Physical Health Resilience Social Support Vocational/Educational ADL's:  Intact Cognition:  WNL  Mental Status Per Nursing Assessment::   On Admission:   suicidal ideation and "high" on drugs  Demographic Factors:  Male, Caucasian, Gay, lesbian, or bisexual orientation and Unemployed  Loss Factors: Financial problems/change in socioeconomic status  Historical Factors: Impulsivity  Risk Reduction Factors:   Sense of responsibility to family, Living with another person, especially a relative and Positive social support  Continued Clinical Symptoms:  Severe Anxiety and/or Agitation Depression:   Impulsivity Alcohol/Substance Abuse/Dependencies More than one psychiatric diagnosis  Cognitive Features That Contribute To Risk:  Closed-mindedness    Suicide Risk:  Minimal: No identifiable suicidal ideation.  Patients presenting with no risk factors but with morbid ruminations; may be classified as minimal risk based on the severity of the depressive symptoms    Plan Of Care/Follow-up recommendations:  Activity:  as tolerated Diet:  Heart Healthy  Laveda Abbe, NP 03/17/2017, 11:07 AM
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
## 2017-03-17 NOTE — ED Notes (Signed)
Pt D/C home per MD order. Discharge summary reviewed with pt. Pt verbalizes understanding. RX provided. Pt denies SI/HI/AVH. Pt signed e-signature. Ambulatory off unit with MHT.

## 2017-03-17 NOTE — Discharge Instructions (Signed)
Follow up:  [facility] [address], [place] [address]   [phone]  [facility] [address] [place], [place] [address]  Phone: [phone]

## 2017-04-08 ENCOUNTER — Ambulatory Visit (HOSPITAL_COMMUNITY)
Admission: EM | Admit: 2017-04-08 | Discharge: 2017-04-08 | Disposition: A | Discharge status: Home / Self Care | Payer: 59 | Attending: Family Medicine | Admitting: Family Medicine

## 2017-04-08 ENCOUNTER — Emergency Department (HOSPITAL_COMMUNITY): Payer: 59

## 2017-04-08 ENCOUNTER — Encounter (HOSPITAL_COMMUNITY): Payer: Self-pay | Admitting: Emergency Medicine

## 2017-04-08 DIAGNOSIS — Z91010 Allergy to peanuts: Secondary | ICD-10-CM | POA: Insufficient documentation

## 2017-04-08 DIAGNOSIS — Z79899 Other long term (current) drug therapy: Secondary | ICD-10-CM | POA: Diagnosis not present

## 2017-04-08 DIAGNOSIS — R05 Cough: Secondary | ICD-10-CM | POA: Diagnosis present

## 2017-04-08 DIAGNOSIS — J45901 Unspecified asthma with (acute) exacerbation: Secondary | ICD-10-CM | POA: Diagnosis not present

## 2017-04-08 DIAGNOSIS — J4551 Severe persistent asthma with (acute) exacerbation: Secondary | ICD-10-CM | POA: Insufficient documentation

## 2017-04-08 DIAGNOSIS — F17210 Nicotine dependence, cigarettes, uncomplicated: Secondary | ICD-10-CM | POA: Insufficient documentation

## 2017-04-08 LAB — CBC WITH DIFFERENTIAL/PLATELET
Basophils Absolute: 0 10*3/uL (ref 0.0–0.1)
Basophils Relative: 0 %
Eosinophils Absolute: 0.3 10*3/uL (ref 0.0–0.7)
Eosinophils Relative: 5 %
HEMATOCRIT: 44 % (ref 39.0–52.0)
HEMOGLOBIN: 15.5 g/dL (ref 13.0–17.0)
LYMPHS ABS: 0.6 10*3/uL — AB (ref 0.7–4.0)
LYMPHS PCT: 12 %
MCH: 30 pg (ref 26.0–34.0)
MCHC: 35.2 g/dL (ref 30.0–36.0)
MCV: 85.3 fL (ref 78.0–100.0)
MONO ABS: 0.7 10*3/uL (ref 0.1–1.0)
MONOS PCT: 12 %
NEUTROS ABS: 3.9 10*3/uL (ref 1.7–7.7)
NEUTROS PCT: 71 %
Platelets: 189 10*3/uL (ref 150–400)
RBC: 5.16 MIL/uL (ref 4.22–5.81)
RDW: 12.8 % (ref 11.5–15.5)
WBC: 5.4 10*3/uL (ref 4.0–10.5)

## 2017-04-08 MED ORDER — PREDNISONE 10 MG (21) PO TBPK: ORAL_TABLET | Freq: Every day | ORAL | 0 refills | Status: DC

## 2017-04-08 NOTE — ED Triage Notes (Signed)
PT reports tingle in throat last night. PT woke up with cough and SOB. PT used inhaler several times today. PT audibly wheezing. PT reports cough is productive of bright yellow phlegm.

## 2017-04-08 NOTE — ED Triage Notes (Signed)
Pt to ED with c/o productive cough and congestion.  Pt st's he was seen at Urgent Care earlier tonight but not feeling any better.

## 2017-04-09 ENCOUNTER — Emergency Department (HOSPITAL_COMMUNITY)
Admission: EM | Admit: 2017-04-09 | Discharge: 2017-04-09 | Disposition: A | Discharge status: Home / Self Care | Payer: 59 | Attending: Emergency Medicine | Admitting: Emergency Medicine

## 2017-04-09 DIAGNOSIS — J4551 Severe persistent asthma with (acute) exacerbation: Secondary | ICD-10-CM

## 2017-04-09 LAB — COMPREHENSIVE METABOLIC PANEL
ALT: 15 U/L — AB (ref 17–63)
AST: 16 U/L (ref 15–41)
Albumin: 4.6 g/dL (ref 3.5–5.0)
Alkaline Phosphatase: 50 U/L (ref 38–126)
Anion gap: 12 (ref 5–15)
BUN: 10 mg/dL (ref 6–20)
CHLORIDE: 96 mmol/L — AB (ref 101–111)
CO2: 25 mmol/L (ref 22–32)
CREATININE: 0.78 mg/dL (ref 0.61–1.24)
Calcium: 9.3 mg/dL (ref 8.9–10.3)
GFR calc Af Amer: >60 mL/min (ref >60)
GFR calc non Af Amer: >60 mL/min (ref >60)
Glucose, Bld: 119 mg/dL — ABNORMAL HIGH (ref 65–99)
POTASSIUM: 3.5 mmol/L (ref 3.5–5.1)
SODIUM: 133 mmol/L — AB (ref 135–145)
Total Bilirubin: 1.5 mg/dL — ABNORMAL HIGH (ref 0.3–1.2)
Total Protein: 7.2 g/dL (ref 6.5–8.1)

## 2017-04-09 MED ORDER — ALBUTEROL SULFATE (2.5 MG/3ML) 0.083% IN NEBU
5.0000 mg | INHALATION_SOLUTION | Freq: Once | RESPIRATORY_TRACT | Status: AC
  Administered 2017-04-09: 5 mg via RESPIRATORY_TRACT
  Filled 2017-04-09: qty 6

## 2017-04-09 MED ORDER — IPRATROPIUM BROMIDE 0.02 % IN SOLN
0.5000 mg | Freq: Once | RESPIRATORY_TRACT | Status: AC
Start: 2017-04-09 — End: 2017-04-09
  Administered 2017-04-09: 0.5 mg via RESPIRATORY_TRACT
  Filled 2017-04-09: qty 2.5

## 2017-04-09 NOTE — ED Notes (Signed)
Pt ambulated with pulse ox, O2 sats 90-91%, HR 130's. Pt denies SOB. Work of breathing WNL, not labored. EDP aware.

## 2017-04-09 NOTE — Progress Notes (Signed)
Pt states that he has a productive cough for X2 days with sputum production being yellow in color. Pt states that he has history of asthma. BBS are inspiratory and expiratory wheezing to ausculation. Respiratory rate and pattern is normal at this time. No respiratory compromise noted at this time.

## 2017-04-09 NOTE — ED Provider Notes (Signed)
[facility]ENC[facility] DEPT Provider Note   CSN: [id number] Arrival date & time: 04/08/17  2245     History   Chief Complaint Chief Complaint  Patient presents with  . Cough  . Shortness of Breath    HPI George Becker is a 21 y.o. male.  The history is provided by the patient and a parent.  Cough  This is a new problem. The current episode started 2 days ago. The problem occurs every few minutes. The problem has been gradually worsening. The cough is non-productive. There has been no fever. Associated symptoms include shortness of breath and wheezing. He is a smoker. His past medical history is significant for asthma.  Shortness of Breath  Associated symptoms include cough and wheezing. Pertinent negatives include no fever. Associated medical issues include asthma.  patient with h/o asthma presents with increasing cough/sob/wheezing/chest tightness for past 2 days No fever No hemoptysis This is similar to prior episodes of asthma No h/o intubation He went to urgent care, started prednisone, but is not feeling improved Last prednisone dose at 2100 on 04/08/17  No h/o VTE  Past Medical History:  Diagnosis Date  . Anxiety   . Asthma     Patient Active Problem List   Diagnosis Date Noted  . Substance induced mood disorder ([facility]) 03/16/2017  . Asthma 01/17/2016  . Asthma exacerbation 10/04/2015  . Polysubstance abuse ([facility]) 10/04/2015    Past Surgical History:  Procedure Laterality Date  . NO PAST SURGERIES         Home Medications    Prior to Admission medications   Medication Sig Start Date End Date Taking? Authorizing Provider  albuterol (PROVENTIL HFA;VENTOLIN HFA) 108 (90 Base) MCG/ACT inhaler Inhale 2 puffs into the lungs every 6 (six) hours as needed for wheezing or shortness of breath. 10/05/15  Yes Enedina Finner, MD  fexofenadine [facility] ALLERGY) 180 MG tablet Take 180 mg by mouth daily.   Yes [provider]  Fluticasone-Salmeterol (ADVAIR DISKUS) 250-50  MCG/DOSE AEPB Inhale 1 puff into the lungs 2 (two) times daily. 06/06/16 06/06/17 Yes Erin Fulling, MD  guaiFENesin (MUCINEX) 600 MG 12 hr tablet Take 600 mg by mouth 2 (two) times daily as needed for cough or to loosen phlegm.   Yes [provider]  predniSONE (STERAPRED UNI-PAK 21 TAB) 10 MG (21) TBPK tablet Take by mouth daily. Take as directed. Patient taking differently: Take 10-60 mg by mouth See admin instructions. Take 6 tablets on day 1 then decrease by 1 tablet each day until all taken 04/08/17  Yes Hagler, Arlys John, MD  sertraline (ZOLOFT) 100 MG tablet Take 100 mg by mouth daily. 02/12/17  Yes [provider]  gabapentin (NEURONTIN) 100 MG capsule Take 2 capsules (200 mg total) by mouth 2 (two) times daily. Patient not taking: Reported on 04/09/2017 03/17/17   Laveda Abbe, NP  traZODone (DESYREL) 50 MG tablet Take 1 tablet (50 mg total) by mouth at bedtime. Patient not taking: Reported on 04/09/2017 03/17/17   Laveda Abbe, NP    Family History Family History  Problem Relation Age of Onset  . Diabetes Mother   . Hypertension Father   . Asthma Maternal Grandfather     Social History Social History  Substance Use Topics  . Smoking status: Current Every Day Smoker    Packs/day: 1.00    Types: Cigarettes  . Smokeless tobacco: Never Used  . Alcohol use 0.0 oz/week     Allergies   Amoxicillin; Augmentin [amoxicillin-pot clavulanate];  Other; and Peanut butter flavor   Review of Systems Review of Systems  Constitutional: Negative for fever.  Respiratory: Positive for cough, shortness of breath and wheezing.   All other systems reviewed and are negative.    Physical Exam Updated Vital Signs BP 119/78   Pulse (!) 128   Temp 99.7 F (37.6 C) (Oral)   Resp (!) 22   Ht 1.778 m ( )   Wt 68 kg (150 lb)   SpO2 100%   BMI 21.52 kg/m   Physical Exam CONSTITUTIONAL: Well developed/well nourished HEAD: Normocephalic/atraumatic EYES:  EOMI/PERRL ENMT: Mucous membranes moist NECK: supple no meningeal signs SPINE/BACK:entire spine nontender CV: S1/S2 noted, no murmurs/rubs/gallops noted, tachycardic LUNGS: tachypneic/wheezing bilaterally ABDOMEN: soft, nontender  GU:no cva tenderness NEURO: Pt is awake/alert/appropriate, moves all extremitiesx4.  No facial droop.   EXTREMITIES: pulses normal/equal, full ROM SKIN: warm, color normal PSYCH: anxious  ED Treatments / Results  Labs (all labs ordered are listed, but only abnormal results are displayed) Labs Reviewed  CBC WITH DIFFERENTIAL/PLATELET - Abnormal; Notable for the following:       Result Value   Lymphs Abs 0.6 (*)    All other components within normal limits  COMPREHENSIVE METABOLIC PANEL - Abnormal; Notable for the following:    Sodium 133 (*)    Chloride 96 (*)    Glucose, Bld 119 (*)    ALT 15 (*)    Total Bilirubin 1.5 (*)    All other components within normal limits    EKG  EKG Interpretation None       Radiology Dg Chest 2 View  Result Date: 04/09/2017 CLINICAL DATA:  Cough for 2 days EXAM: CHEST  2 VIEW COMPARISON:  10/03/2015 FINDINGS: The lungs are clear. The pulmonary vasculature is normal. Heart size is normal. Hilar and mediastinal contours are unremarkable. There is no pleural effusion. IMPRESSION: No active cardiopulmonary disease. Electronically Signed   By: Ellery Plunk M.D.   On: 04/09/2017 00:00    Procedures Procedures (including critical care time)  Medications Ordered in ED Medications  albuterol (PROVENTIL) (2.5 MG/3ML) 0.083% nebulizer solution 5 mg (5 mg Nebulization Given 04/09/17 0229)  ipratropium (ATROVENT) nebulizer solution 0.5 mg (0.5 mg Nebulization Given 04/09/17 0229)  albuterol (PROVENTIL) (2.5 MG/3ML) 0.083% nebulizer solution 5 mg (5 mg Nebulization Given 04/09/17 0337)     Initial Impression / Assessment and Plan / ED Course  I have reviewed the triage vital signs and the nursing  notes.  Pertinent labs & imaging results that were available during my care of the patient were reviewed by me and considered in my medical decision making (see chart for details).     3:10 AM Pt with asthma exacerbation Will treat and reassess 4:42 AM Pt improved after neb treatments He has residual wheeze but work of breathing improved He is in no distress Pulse ox >92% He would like to be discharge He is tachycardic but endorses anxiety which is likely contributing as well as albuterol usage  Will d/c home We discussed use of albuterol and continue to use prednisone  We discussed strict ER return precautions Advised to quit smoking  Doubt PE/CHF or other acute cardiopulmonary emergency Final Clinical Impressions(s) / ED Diagnoses   Final diagnoses:  Severe persistent asthma with exacerbation    New Prescriptions New Prescriptions   No medications on file     Zadie Rhine, MD 04/09/17 [phone]

## 2017-04-09 NOTE — ED Provider Notes (Signed)
  [facility]   [id number] 04/08/17 Arrival Time: 1809  ASSESSMENT & PLAN:  1. Moderate asthma with exacerbation, unspecified whether persistent     Meds ordered this encounter  Medications  . predniSONE (STERAPRED UNI-PAK 21 TAB) 10 MG (21) TBPK tablet    Sig: Take by mouth daily. Take as directed.    Dispense:  21 tablet    Refill:  0   Has albuterol MDI at home to use. ED if worsening tonight. Reviewed expectations re: course of current medical issues. Questions answered. Outlined signs and symptoms indicating need for more acute intervention. Patient verbalized understanding. After Visit Summary given.   SUBJECTIVE:  George Becker is a 21 y.o. male who presents with complaint of asthma exacerbation. Gradual onset over the past 1-2 days. Does smoke cigarettes. No recent illnesses but is nasal congestion. Increasing wheezing today along with cough. Albuterol MDI with only temporary relief. No n/v. No OTC treatment.  ROS: As per HPI.   OBJECTIVE:  Vitals:   04/08/17 1848 04/08/17 1849 04/08/17 1901  BP: (!) 143/119 (!) 141/109   Pulse: (!) 142    Resp: 20    Temp: 99 F (37.2 C)    TempSrc: Oral    SpO2: 93%    Weight:  150 lb (68 kg) 150 lb (68 kg)  Height:   (1.778 m)  (1.778 m)    General appearance: alert; no distress Eyes: PERRLA; EOMI; conjunctiva normal HENT: normocephalic; atraumatic Neck: supple Lungs: wheezing bilaterally without respiratory distress; able to speak in full sentences Heart: regular rate and rhythm Chest Wall: nontender Skin: warm and dry Psychological: alert and cooperative; normal mood and affect   Allergies  Allergen Reactions  . Amoxicillin Anaphylaxis and Hives  . Augmentin [Amoxicillin-Pot Clavulanate] Anaphylaxis and Hives  . Other Anaphylaxis    "all Nuts"  . Peanut Butter Flavor     Past Medical History:  Diagnosis Date  . Anxiety   . Asthma    Social History   Social History  . Marital  status: Single    Spouse name: N/A  . Number of children: N/A  . Years of education: N/A   Occupational History  . Not on file.   Social History Main Topics  . Smoking status: Current Every Day Smoker    Packs/day: 1.00    Types: Cigarettes  . Smokeless tobacco: Never Used  . Alcohol use 0.0 oz/week  . Drug use: Yes  . Sexual activity: Not on file   Other Topics Concern  . Not on file   Social History Narrative  . No narrative on file   Family History  Problem Relation Age of Onset  . Diabetes Mother   . Hypertension Father   . Asthma Maternal Grandfather    Past Surgical History:  Procedure Laterality Date  . NO PAST SURGERIES       Mardella Layman, MD 04/09/17 1020

## 2017-05-16 ENCOUNTER — Encounter (HOSPITAL_COMMUNITY): Payer: Self-pay | Admitting: Emergency Medicine

## 2017-05-16 ENCOUNTER — Emergency Department (HOSPITAL_COMMUNITY)
Admission: EM | Admit: 2017-05-16 | Discharge: 2017-05-16 | Disposition: A | Discharge status: Home / Self Care | Payer: 59 | Attending: Emergency Medicine | Admitting: Emergency Medicine

## 2017-05-16 DIAGNOSIS — R002 Palpitations: Secondary | ICD-10-CM | POA: Diagnosis not present

## 2017-05-16 DIAGNOSIS — Z91010 Allergy to peanuts: Secondary | ICD-10-CM | POA: Diagnosis not present

## 2017-05-16 DIAGNOSIS — J45909 Unspecified asthma, uncomplicated: Secondary | ICD-10-CM | POA: Diagnosis not present

## 2017-05-16 DIAGNOSIS — F17210 Nicotine dependence, cigarettes, uncomplicated: Secondary | ICD-10-CM | POA: Diagnosis not present

## 2017-05-16 DIAGNOSIS — Z79899 Other long term (current) drug therapy: Secondary | ICD-10-CM | POA: Diagnosis not present

## 2017-05-16 LAB — CBC WITH DIFFERENTIAL/PLATELET
Basophils Absolute: 0 10*3/uL (ref 0.0–0.1)
Basophils Relative: 0 %
Eosinophils Absolute: 0.7 10*3/uL (ref 0.0–0.7)
Eosinophils Relative: 10 %
HEMATOCRIT: 44.6 % (ref 39.0–52.0)
HEMOGLOBIN: 15.4 g/dL (ref 13.0–17.0)
LYMPHS ABS: 2.5 10*3/uL (ref 0.7–4.0)
LYMPHS PCT: 35 %
MCH: 30.1 pg (ref 26.0–34.0)
MCHC: 34.5 g/dL (ref 30.0–36.0)
MCV: 87.3 fL (ref 78.0–100.0)
MONOS PCT: 9 %
Monocytes Absolute: 0.7 10*3/uL (ref 0.1–1.0)
NEUTROS ABS: 3.4 10*3/uL (ref 1.7–7.7)
NEUTROS PCT: 46 %
Platelets: 228 10*3/uL (ref 150–400)
RBC: 5.11 MIL/uL (ref 4.22–5.81)
RDW: 13.6 % (ref 11.5–15.5)
WBC: 7.3 10*3/uL (ref 4.0–10.5)

## 2017-05-16 LAB — COMPREHENSIVE METABOLIC PANEL
ALK PHOS: 45 U/L (ref 38–126)
ALT: 15 U/L — ABNORMAL LOW (ref 17–63)
ANION GAP: 10 (ref 5–15)
AST: 17 U/L (ref 15–41)
Albumin: 4.1 g/dL (ref 3.5–5.0)
BILIRUBIN TOTAL: 0.5 mg/dL (ref 0.3–1.2)
BUN: 13 mg/dL (ref 6–20)
CALCIUM: 9.2 mg/dL (ref 8.9–10.3)
CO2: 29 mmol/L (ref 22–32)
CREATININE: 0.71 mg/dL (ref 0.61–1.24)
Chloride: 97 mmol/L — ABNORMAL LOW (ref 101–111)
GFR calc non Af Amer: >60 mL/min (ref >60)
GLUCOSE: 120 mg/dL — AB (ref 65–99)
Potassium: 4 mmol/L (ref 3.5–5.1)
Sodium: 136 mmol/L (ref 135–145)
TOTAL PROTEIN: 7.1 g/dL (ref 6.5–8.1)

## 2017-05-16 LAB — MAGNESIUM: Magnesium: 2.1 mg/dL (ref 1.7–2.4)

## 2017-05-16 LAB — TSH: TSH: 4.168 u[IU]/mL (ref 0.350–4.500)

## 2017-05-16 NOTE — ED Triage Notes (Signed)
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 Pt denies any drug use tonight

## 2017-05-16 NOTE — ED Provider Notes (Signed)
TIME SEEN: 12:36 AM  CHIEF COMPLAINT: palpitations  HPI: Patient is a 21 y.o. M with h/o substance abuse, asthma, anxiety who presents to the ED with palpitations that started tonight while at rest.  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  States that while at rest after taking his Seroquel (which is not a new medication for him) that he started having palpitations.  States that his heart rate was measured by staff was 199 and regular.  He denies CP, SOB, N/V with this episode.  Has been feeling normal recently.  No recent fever, cough, V, D.  Was not feeling anxious tonight.  Denies h/o arrhythmia.  Symptoms resolved spontaneously waiting on EMS.  Now in NSR.  ROS: See HPI Constitutional: no fever  Eyes: no drainage  ENT: no runny nose   Cardiovascular:  no chest pain; + palpitations Resp: no SOB  GI: no vomiting GU: no dysuria Integumentary: no rash  Allergy: no hives  Musculoskeletal: no leg swelling  Neurological: no slurred speech ROS otherwise negative  PAST MEDICAL HISTORY/PAST SURGICAL HISTORY:  Past Medical History:  Diagnosis Date  . Anxiety   . Asthma     MEDICATIONS:  Prior to Admission medications   Medication Sig Start Date End Date Taking? Authorizing Provider  albuterol (PROVENTIL HFA;VENTOLIN HFA) 108 (90 Base) MCG/ACT inhaler Inhale 2 puffs into the lungs every 6 (six) hours as needed for wheezing or shortness of breath. 10/05/15   Enedina FinnerPatel, Sona, MD  fexofenadine [facility](ALLEGRA ALLERGY) 180 MG tablet Take 180 mg by mouth daily.    [provider]  Fluticasone-Salmeterol (ADVAIR DISKUS) 250-50 MCG/DOSE AEPB Inhale 1 puff into the lungs 2 (two) times daily. 06/06/16 06/06/17  Erin FullingKasa, Kurian, MD  gabapentin (NEURONTIN) 100 MG capsule Take 2 capsules (200 mg total) by mouth 2 (two) times daily. Patient not taking: Reported on 04/09/2017 03/17/17   Laveda AbbeParks, Laurie Britton, NP  guaiFENesin (MUCINEX) 600 MG 12 hr  tablet Take 600 mg by mouth 2 (two) times daily as needed for cough or to loosen phlegm.    [provider]  predniSONE (STERAPRED UNI-PAK 21 TAB) 10 MG (21) TBPK tablet Take by mouth daily. Take as directed. Patient taking differently: Take 10-60 mg by mouth See admin instructions. Take 6 tablets on day 1 then decrease by 1 tablet each day until all taken 04/08/17   Mardella LaymanHagler, Brian, MD  sertraline (ZOLOFT) 100 MG tablet Take 100 mg by mouth daily. 02/12/17   [provider]  traZODone (DESYREL) 50 MG tablet Take 1 tablet (50 mg total) by mouth at bedtime. Patient not taking: Reported on 04/09/2017 03/17/17   Laveda AbbeParks, Laurie Britton, NP    ALLERGIES:  Allergies  Allergen Reactions  . Amoxicillin Anaphylaxis and Hives  . Augmentin [Amoxicillin-Pot Clavulanate] Anaphylaxis and Hives  . Other Anaphylaxis    "all Nuts"  . Peanut Butter Flavor     SOCIAL HISTORY:  Social History   Tobacco Use  . Smoking status: Current Every Day Smoker    Packs/day: 1.00    Types: Cigarettes  . Smokeless tobacco: Never Used  Substance Use Topics  . Alcohol use: Yes    Alcohol/week: 0.0 oz    FAMILY HISTORY: Family History  Problem Relation Age of Onset  . Diabetes Mother   . Hypertension Father   . Asthma Maternal Grandfather     EXAM: BP 133/85   Pulse 96   Temp  97.7 F (36.5 C) (Oral)   Resp 14   Ht 5\' 11"  (1.803 m)   Wt 70.8 kg (156 lb)   SpO2 99%   BMI 21.76 kg/m  CONSTITUTIONAL: Alert and oriented and responds appropriately to questions. Well-appearing; well-nourished HEAD: Normocephalic EYES: Conjunctivae clear, pupils appear equal, EOMI ENT: normal nose; moist mucous membranes NECK: Supple, no meningismus, no nuchal rigidity, no LAD  CARD: RRR; S1 and S2 appreciated; no murmurs, no clicks, no rubs, no gallops RESP: Normal chest excursion without splinting or tachypnea; breath sounds clear and equal bilaterally; no wheezes, no rhonchi, no rales, no hypoxia or  respiratory distress, speaking full sentences ABD/GI: Normal bowel sounds; non-distended; soft, non-tender, no rebound, no guarding, no peritoneal signs, no hepatosplenomegaly BACK:  The back appears normal and is non-tender to palpation, there is no CVA tenderness EXT: Normal ROM in all joints; non-tender to palpation; no edema; normal capillary refill; no cyanosis, no calf tenderness or swelling    SKIN: Normal color for age and race; warm; no rash NEURO: Moves all extremities equally PSYCH: The patient's mood and manner are appropriate. Grooming and personal hygiene are appropriate.  MEDICAL DECISION MAKING: Pt here with what sounds like AVNRT.  It was in the 190s and regular.  He states he became asymptomatic several minutes later.  Did not perform vagal maneuvers.  Will check labs to evaluate for anemia, electrolyte abnormality, dehydration, thyroid dysfunction.  ED PROGRESS: Patient continues to stay in a sinus rhythm.  His labs are unremarkable including normal electrolytes, hemoglobin, TSH.  I feel he is safe to be discharged home and follow-up with cardiology as an outpatient.  Discussed return precautions.  [facility] to see if we can discharge him back to his bed.  His mother will take him back.   At this time, I do not feel there is any life-threatening condition present. I have reviewed and discussed all results (EKG, imaging, lab, urine as appropriate) and exam findings with patient/family. I have reviewed nursing notes and appropriate previous records.  I feel the patient is safe to be discharged home without further emergent workup and can continue workup as an outpatient as needed. Discussed usual and customary return precautions. Patient/family verbalize understanding and are comfortable with this plan.  Outpatient follow-up has been provided if needed. All questions have been answered.      EKG Interpretation  Date/Time:  Friday May 16 2017 00:17:33  EST Ventricular Rate:  99 PR Interval:    QRS Duration: 93 QT Interval:  342 QTC Calculation: 439 R Axis:   83 Text Interpretation:  Sinus rhythm Rate is slower compared to previous EKG Confirmed by Nitesh Pitstick, Baxter HireKristen [phone] on 05/16/2017 12:36:46 AM         Eddith Mentor, Layla MawKristen N, DO 05/16/17 [id number]

## 2017-05-21 ENCOUNTER — Ambulatory Visit (INDEPENDENT_AMBULATORY_CARE_PROVIDER_SITE_OTHER): Payer: Managed Care, Other (non HMO) | Admitting: Cardiology

## 2017-05-21 ENCOUNTER — Encounter: Payer: Self-pay | Admitting: Cardiology

## 2017-05-21 VITALS — BP 114/62 | HR 112 | Ht 71.0 in | Wt 158.0 lb

## 2017-05-21 DIAGNOSIS — F17210 Nicotine dependence, cigarettes, uncomplicated: Secondary | ICD-10-CM

## 2017-05-21 DIAGNOSIS — F1910 Other psychoactive substance abuse, uncomplicated: Secondary | ICD-10-CM | POA: Diagnosis not present

## 2017-05-21 DIAGNOSIS — R002 Palpitations: Secondary | ICD-10-CM | POA: Diagnosis not present

## 2017-05-21 MED ORDER — DILTIAZEM HCL 60 MG PO TABS: 60.0000 mg | ORAL_TABLET | Freq: Four times a day (QID) | ORAL | 0 refills | Status: DC | PRN

## 2017-05-21 NOTE — Progress Notes (Signed)
Cardiology Office Note:    Date:  05/21/2017   ID:  George Becker, DOB May 24, 1996, MRN [id number]  PCP:  Ronnette JuniperPringle, Joseph, MD  Cardiologist:  Garwin Brothersajan R Kalayna Noy, MD   Referring MD: Ronnette JuniperPringle, Joseph, MD    ASSESSMENT:    1. Palpitations   2. Polysubstance abuse (HCC)   3. Continuous dependence on cigarette smoking    PLAN:    In order of problems listed above:  1. I discussed my findings with the patient at extensive length.  Overall his EKG looks unremarkable.  His TSH is fine.  Echocardiogram will be done to assess murmur heard on auscultation.  He also has history of intravenous drug abuse and therefore I would like to assess his echocardiogram for any vegetations or any such problems.  I do not hear a impressive murmur on auscultation. 2. In view of palpitations I will obtain a 48-hour Holter monitor.  His TSH is fine.  I will give him calcium channel blocker diltiazem 60 mg to be taken only on a as needed basis as his blood pressure is borderline.  Precautions were advised. 3. I spent 5 minutes with the patient discussing solely about smoking. Smoking cessation was counseled. I suggested to the patient also different medications and pharmacological interventions. Patient is keen to try stopping on its own at this time. He will get back to me if he needs any further assistance in this matter. 4. I advised him never to go back to drugs in any form he agrees and vocalized understanding. 5. Patient will be seen in follow-up appointment in 3 months or earlier if the patient has any concerns    Medication Adjustments/Labs and Tests Ordered: Current medicines are reviewed at length with the patient today.  Concerns regarding medicines are outlined above.  Orders Placed This Encounter  Procedures  . HOLTER MONITOR - 48 HOUR  . ECHOCARDIOGRAM COMPLETE   Meds ordered this encounter  Medications  . diltiazem (CARDIZEM) 60 MG tablet    Sig: Take 1 tablet (60 mg total) by mouth 4 (four)  times daily as needed.    Dispense:  120 tablet    Refill:  0     History of Present Illness:    George Becker is a 21 y.o. male who is being seen today for the evaluation of palpitations at the request of Ronnette JuniperPringle, Joseph, MD.  Patient is a pleasant 21 year old male.  He has past medical history of substance abuse and cigarette smoking.  He tells me that he has been in a detoxification program for the past several days.  He had a recurrence of substance abuse problems.  It appears this time that he is determined to quit.  He denies any chest pain orthopnea PND or any palpitations.  He was told that he may have SVT though there is no documentation of this.  He is gone to the emergency room with the symptoms.  Time of my evaluation he is alert awake oriented and in no distress.  He leads a sedentary lifestyle.    Past Medical History:  Diagnosis Date  . Anxiety   . Asthma     Past Surgical History:  Procedure Laterality Date  . NO PAST SURGERIES      Current Medications: Current Meds  Medication Sig  . albuterol (PROVENTIL HFA;VENTOLIN HFA) 108 (90 Base) MCG/ACT inhaler Inhale 2 puffs into the lungs every 6 (six) hours as needed for wheezing or shortness of breath.  . fexofenadine (ALLEGRA ALLERGY) 180  MG tablet Take 180 mg by mouth daily.  . Fluticasone-Salmeterol (ADVAIR DISKUS) 250-50 MCG/DOSE AEPB Inhale 1 puff into the lungs 2 (two) times daily.     Allergies:   Amoxicillin; Augmentin [amoxicillin-pot clavulanate]; Other; and Peanut butter flavor   Social History   Socioeconomic History  . Marital status: Single    Spouse name: None  . Number of children: None  . Years of education: None  . Highest education level: None  Social Needs  . Financial resource strain: None  . Food insecurity - worry: None  . Food insecurity - inability: None  . Transportation needs - medical: None  . Transportation needs - non-medical: None  Occupational History  . None  Tobacco Use  .  Smoking status: Current Every Day Smoker    Packs/day: 1.00    Types: Cigarettes  . Smokeless tobacco: Never Used  Substance and Sexual Activity  . Alcohol use: Yes    Alcohol/week: 0.0 oz  . Drug use: Yes    Types: Heroin, Methylphenidate    Comment: Treatment for 10 days   . Sexual activity: None  Other Topics Concern  . None  Social History Narrative  . None     Family History: The patient's family history includes Asthma in his maternal grandfather; Diabetes in his mother; Hypertension in his father.  ROS:   Please see the history of present illness.    All other systems reviewed and are negative.  EKGs/Labs/Other Studies Reviewed:    The following studies were reviewed today: I reviewed records from the emergency room including lab work, TSH and EKG.  I discussed these findings with the patient at length.   Recent Labs: 05/16/2017: ALT 15; BUN 13; Creatinine, Ser 0.71; Hemoglobin 15.4; Magnesium 2.1; Platelets 228; Potassium 4.0; Sodium 136; TSH 4.168  Recent Lipid Panel No results found for: CHOL, TRIG, HDL, CHOLHDL, VLDL, LDLCALC, LDLDIRECT  Physical Exam:    VS:  BP 114/62   Pulse (!) 112   Ht 5\' 11"  (1.803 m)   Wt 158 lb (71.7 kg)   SpO2 98%   BMI 22.04 kg/m     Wt Readings from Last 3 Encounters:  05/21/17 158 lb (71.7 kg)  05/16/17 156 lb (70.8 kg)  04/09/17 150 lb (68 kg)     GEN: Patient is in no acute distress HEENT: Normal NECK: No JVD; No carotid bruits LYMPHATICS: No lymphadenopathy CARDIAC: S1 S2 regular, 2/6 systolic murmur at the apex. RESPIRATORY:  Clear to auscultation without rales, wheezing or rhonchi  ABDOMEN: Soft, non-tender, non-distended MUSCULOSKELETAL:  No edema; No deformity  SKIN: Warm and dry NEUROLOGIC:  Alert and oriented x 3 PSYCHIATRIC:  Normal affect    Signed, Garwin Brothersajan R Tyrees Chopin, MD  05/21/2017 10:14 AM     [facility]

## 2017-05-21 NOTE — Patient Instructions (Signed)
Medication Instructions:  Your physician has recommended you make the following change in your medication:  START diltiazem 60 mg- 4 times a day as needed  Labwork: None  Testing/Procedures: Your physician has requested that you have an echocardiogram. Echocardiography is a painless test that uses sound waves to create images of your heart. It provides your doctor with information about the size and shape of your heart and how well your heart's chambers and valves are working. This procedure takes approximately one hour. There are no restrictions for this procedure.  Your physician has recommended that you wear a holter monitor. Holter monitors are medical devices that record the heart's electrical activity. Doctors most often use these monitors to diagnose arrhythmias. Arrhythmias are problems with the speed or rhythm of the heartbeat. The monitor is a small, portable device. You can wear one while you do your normal daily activities. This is usually used to diagnose what is causing palpitations/syncope (passing out).    Follow-Up: Your physician recommends that you schedule a follow-up appointment in: 3 months   Any Other Special Instructions Will Be Listed Below (If Applicable).     If you need a refill on your cardiac medications before your next appointment, please call your pharmacy.   [facility]  Garey Ham, RN, BSN   Diltiazem tablets What is this medicine? DILTIAZEM (dil TYE a zem) is a calcium-channel blocker. It affects the amount of calcium found in your heart and muscle cells. This relaxes your blood vessels, which can reduce the amount of work the heart has to do. This medicine is used to treat chest pain caused by angina. This medicine may be used for other purposes; ask your health care provider or pharmacist if you have questions. COMMON BRAND NAME(S): Cardizem What should I tell my health care provider before I take this medicine? They need to know if you  have any of these conditions: -heart problems, low blood pressure, irregular heartbeat -liver disease -previous heart attack -an unusual or allergic reaction to diltiazem, other medicines, foods, dyes, or preservatives -pregnant or trying to get pregnant -breast-feeding How should I use this medicine? Take this medicine by mouth with a glass of water. Follow the directions on the prescription label. Do not cut, crush or chew this medicine. This medicine is usually taken before meals and at bedtime. Take your doses at regular intervals. Do not take your medicine more often then directed. Do not stop taking except on the advice of your doctor or health care professional. Talk to your pediatrician regarding the use of this medicine in children. Special care may be needed. Overdosage: If you think you have taken too much of this medicine contact a poison control center or emergency room at once. NOTE: This medicine is only for you. Do not share this medicine with others. What if I miss a dose? If you miss a dose, take it as soon as you can. If it is almost time for your next dose, take only that dose. Do not take double or extra doses. What may interact with this medicine? Do not take this medicine with any of the following: -cisapride -hawthorn -pimozide -ranolazine -red yeast rice This medicine may also interact with the following medications: -buspirone -carbamazepine -cimetidine -cyclosporine -digoxin -local anesthetics or general anesthetics -lovastatin -medicines for anxiety or difficulty sleeping like midazolam and triazolam -medicines for high blood pressure or heart problems -quinidine -rifampin, rifabutin, or rifapentine This list may not describe all possible interactions. Give your health  care provider a list of all the medicines, herbs, non-prescription drugs, or dietary supplements you use. Also tell them if you smoke, drink alcohol, or use illegal drugs. Some items may  interact with your medicine. What should I watch for while using this medicine? Check your blood pressure and pulse rate regularly. Ask your doctor or health care professional what your blood pressure and pulse rate should be and when you should contact him or her. You may feel dizzy or lightheaded. Do not drive, use machinery, or do anything that needs mental alertness until you know how this medicine affects you. To reduce the risk of dizzy or fainting spells, do not sit or stand up quickly, especially if you are an older patient. Alcohol can make you more dizzy or increase flushing and rapid heartbeats. Avoid alcoholic drinks. What side effects may I notice from receiving this medicine? Side effects that you should report to your doctor or health care professional as soon as possible: -allergic reactions like skin rash, itching or hives, swelling of the face, lips, or tongue -confusion, mental depression -feeling faint or lightheaded, falls -pinpoint red spots on the skin -redness, blistering, peeling or loosening of the skin, including inside the mouth -slow, irregular heartbeat -swelling of the ankles, feet -unusual bleeding or bruising Side effects that usually do not require medical attention (report to your doctor or health care professional if they continue or are bothersome): -change in sex drive or performance -constipation or diarrhea -flushing of the face -headache -nausea, vomiting -tired or weak -trouble sleeping This list may not describe all possible side effects. Call your doctor for medical advice about side effects. You may report side effects to FDA at 1-800-FDA-1088. Where should I keep my medicine? Keep out of the reach of children. Store at room temperature between 20 and 25 degrees C (68 and 77 degrees F). Protect from light. Keep container tightly closed. Throw away any unused medicine after the expiration date. NOTE: This sheet is a summary. It may not cover all  possible information. If you have questions about this medicine, talk to your doctor, pharmacist, or health care provider.  2018 Elsevier/Gold Standard (2013-05-31 10:54:31) Echocardiogram An echocardiogram, or echocardiography, uses sound waves (ultrasound) to produce an image of your heart. The echocardiogram is simple, painless, obtained within a short period of time, and offers valuable information to your health care provider. The images from an echocardiogram can provide information such as:  Evidence of coronary artery disease (CAD).  Heart size.  Heart muscle function.  Heart valve function.  Aneurysm detection.  Evidence of a past heart attack.  Fluid buildup around the heart.  Heart muscle thickening.  Assess heart valve function.  Tell a health care provider about:  Any allergies you have.  All medicines you are taking, including vitamins, herbs, eye drops, creams, and over-the-counter medicines.  Any problems you or family members have had with anesthetic medicines.  Any blood disorders you have.  Any surgeries you have had.  Any medical conditions you have.  Whether you are pregnant or may be pregnant. What happens before the procedure? No special preparation is needed. Eat and drink normally. What happens during the procedure?  In order to produce an image of your heart, gel will be applied to your chest and a wand-like tool (transducer) will be moved over your chest. The gel will help transmit the sound waves from the transducer. The sound waves will harmlessly bounce off your heart to allow the heart images  to be captured in real-time motion. These images will then be recorded.  You may need an IV to receive a medicine that improves the quality of the pictures. What happens after the procedure? You may return to your normal schedule including diet, activities, and medicines, unless your health care provider tells you otherwise. This information is not  intended to replace advice given to you by your health care provider. Make sure you discuss any questions you have with your health care provider. Document Released: 06/14/2000 Document Revised: 02/03/2016 Document Reviewed: 02/22/2013 Elsevier Interactive Patient Education  2017 Elsevier Inc.  Holter Monitoring A Holter monitor is a small device that is used to detect abnormal heart rhythms. It clips to your clothing and is connected by wires to flat, sticky disks (electrodes) that attach to your chest. It is worn continuously for 24-48 hours. Follow these instructions at home:  Wear your Holter monitor at all times, even while exercising and sleeping, for as long as directed by your health care provider.  Make sure that the Holter monitor is safely clipped to your clothing or close to your body as recommended by your health care provider.  Do not get the monitor or wires wet.  Do not put body lotion or moisturizer on your chest.  Keep your skin clean.  Keep a diary of your daily activities, such as walking and doing chores. If you feel that your heartbeat is abnormal or that your heart is fluttering or skipping a beat: ? Record what you are doing when it happens. ? Record what time of day the symptoms occur.  Return your Holter monitor as directed by your health care provider.  Keep all follow-up visits as directed by your health care provider. This is important. Get help right away if:  You feel lightheaded or you faint.  You have trouble breathing.  You feel pain in your chest, upper arm, or jaw.  You feel sick to your stomach and your skin is pale, cool, or damp.  You heartbeat feels unusual or abnormal. This information is not intended to replace advice given to you by your health care provider. Make sure you discuss any questions you have with your health care provider. Document Released: 03/15/2004 Document Revised: 11/23/2015 Document Reviewed: 01/24/2014 Elsevier  Interactive Patient Education  [employer]2018 Elsevier Inc.

## 2017-05-27 ENCOUNTER — Observation Stay (HOSPITAL_COMMUNITY)
Admission: EM | Admit: 2017-05-27 | Discharge: 2017-05-28 | Disposition: A | Discharge status: Home / Self Care | Payer: 59 | Attending: Family Medicine | Admitting: Family Medicine

## 2017-05-27 ENCOUNTER — Emergency Department (HOSPITAL_COMMUNITY): Payer: 59

## 2017-05-27 ENCOUNTER — Encounter (HOSPITAL_COMMUNITY): Payer: Self-pay

## 2017-05-27 ENCOUNTER — Other Ambulatory Visit: Payer: Self-pay

## 2017-05-27 DIAGNOSIS — K219 Gastro-esophageal reflux disease without esophagitis: Secondary | ICD-10-CM | POA: Diagnosis not present

## 2017-05-27 DIAGNOSIS — Z79899 Other long term (current) drug therapy: Secondary | ICD-10-CM | POA: Diagnosis not present

## 2017-05-27 DIAGNOSIS — R0902 Hypoxemia: Secondary | ICD-10-CM

## 2017-05-27 DIAGNOSIS — Z7951 Long term (current) use of inhaled steroids: Secondary | ICD-10-CM | POA: Diagnosis not present

## 2017-05-27 DIAGNOSIS — J45901 Unspecified asthma with (acute) exacerbation: Secondary | ICD-10-CM | POA: Insufficient documentation

## 2017-05-27 DIAGNOSIS — Z91018 Allergy to other foods: Secondary | ICD-10-CM | POA: Diagnosis not present

## 2017-05-27 DIAGNOSIS — J189 Pneumonia, unspecified organism: Secondary | ICD-10-CM | POA: Diagnosis not present

## 2017-05-27 DIAGNOSIS — Z8249 Family history of ischemic heart disease and other diseases of the circulatory system: Secondary | ICD-10-CM | POA: Diagnosis not present

## 2017-05-27 DIAGNOSIS — J181 Lobar pneumonia, unspecified organism: Secondary | ICD-10-CM | POA: Diagnosis present

## 2017-05-27 DIAGNOSIS — F17210 Nicotine dependence, cigarettes, uncomplicated: Secondary | ICD-10-CM | POA: Insufficient documentation

## 2017-05-27 DIAGNOSIS — F418 Other specified anxiety disorders: Secondary | ICD-10-CM | POA: Diagnosis not present

## 2017-05-27 DIAGNOSIS — Z880 Allergy status to penicillin: Secondary | ICD-10-CM | POA: Insufficient documentation

## 2017-05-27 DIAGNOSIS — Z833 Family history of diabetes mellitus: Secondary | ICD-10-CM | POA: Diagnosis not present

## 2017-05-27 DIAGNOSIS — F1910 Other psychoactive substance abuse, uncomplicated: Secondary | ICD-10-CM | POA: Diagnosis not present

## 2017-05-27 DIAGNOSIS — F1911 Other psychoactive substance abuse, in remission: Secondary | ICD-10-CM | POA: Diagnosis present

## 2017-05-27 DIAGNOSIS — Z825 Family history of asthma and other chronic lower respiratory diseases: Secondary | ICD-10-CM | POA: Insufficient documentation

## 2017-05-27 DIAGNOSIS — A419 Sepsis, unspecified organism: Secondary | ICD-10-CM | POA: Insufficient documentation

## 2017-05-27 DIAGNOSIS — J4541 Moderate persistent asthma with (acute) exacerbation: Secondary | ICD-10-CM | POA: Diagnosis present

## 2017-05-27 DIAGNOSIS — J9601 Acute respiratory failure with hypoxia: Secondary | ICD-10-CM | POA: Diagnosis not present

## 2017-05-27 LAB — PROCALCITONIN: PROCALCITONIN: 0.47 ng/mL

## 2017-05-27 LAB — RESPIRATORY PANEL BY PCR
ADENOVIRUS-RVPPCR: NOT DETECTED
Bordetella pertussis: NOT DETECTED
CHLAMYDOPHILA PNEUMONIAE-RVPPCR: NOT DETECTED
CORONAVIRUS 229E-RVPPCR: NOT DETECTED
CORONAVIRUS HKU1-RVPPCR: NOT DETECTED
CORONAVIRUS NL63-RVPPCR: NOT DETECTED
CORONAVIRUS OC43-RVPPCR: NOT DETECTED
Influenza A: NOT DETECTED
Influenza B: NOT DETECTED
MYCOPLASMA PNEUMONIAE-RVPPCR: NOT DETECTED
Metapneumovirus: NOT DETECTED
PARAINFLUENZA VIRUS 1-RVPPCR: NOT DETECTED
Parainfluenza Virus 2: NOT DETECTED
Parainfluenza Virus 3: NOT DETECTED
Parainfluenza Virus 4: NOT DETECTED
Respiratory Syncytial Virus: NOT DETECTED
Rhinovirus / Enterovirus: DETECTED — AB

## 2017-05-27 LAB — EXPECTORATED SPUTUM ASSESSMENT W GRAM STAIN, RFLX TO RESP C

## 2017-05-27 LAB — CBC
HCT: 37.2 % — ABNORMAL LOW (ref 39.0–52.0)
Hemoglobin: 12.7 g/dL — ABNORMAL LOW (ref 13.0–17.0)
MCH: 30.2 pg (ref 26.0–34.0)
MCHC: 34.1 g/dL (ref 30.0–36.0)
MCV: 88.4 fL (ref 78.0–100.0)
PLATELETS: 199 10*3/uL (ref 150–400)
RBC: 4.21 MIL/uL — AB (ref 4.22–5.81)
RDW: 14.2 % (ref 11.5–15.5)
WBC: 12.5 10*3/uL — ABNORMAL HIGH (ref 4.0–10.5)

## 2017-05-27 LAB — RAPID URINE DRUG SCREEN, HOSP PERFORMED
AMPHETAMINES: NOT DETECTED
BENZODIAZEPINES: NOT DETECTED
Barbiturates: NOT DETECTED
Cocaine: NOT DETECTED
OPIATES: NOT DETECTED
Tetrahydrocannabinol: NOT DETECTED

## 2017-05-27 LAB — BASIC METABOLIC PANEL
Anion gap: 8 (ref 5–15)
BUN: 11 mg/dL (ref 6–20)
CALCIUM: 8.4 mg/dL — AB (ref 8.9–10.3)
CO2: 27 mmol/L (ref 22–32)
CREATININE: 0.66 mg/dL (ref 0.61–1.24)
Chloride: 103 mmol/L (ref 101–111)
GFR calc Af Amer: >60 mL/min (ref >60)
GLUCOSE: 119 mg/dL — AB (ref 65–99)
Potassium: 3.6 mmol/L (ref 3.5–5.1)
SODIUM: 138 mmol/L (ref 135–145)

## 2017-05-27 LAB — INFLUENZA PANEL BY PCR (TYPE A & B)
INFLAPCR: NEGATIVE
Influenza B By PCR: NEGATIVE

## 2017-05-27 LAB — LACTIC ACID, PLASMA
LACTIC ACID, VENOUS: 3.7 mmol/L — AB (ref 0.5–1.9)
LACTIC ACID, VENOUS: 2 mmol/L — AB (ref 0.5–1.9)
Lactic Acid, Venous: 1.2 mmol/L (ref 0.5–1.9)
Lactic Acid, Venous: 1.9 mmol/L (ref 0.5–1.9)

## 2017-05-27 LAB — MRSA PCR SCREENING: MRSA by PCR: NEGATIVE

## 2017-05-27 LAB — HIV ANTIBODY (ROUTINE TESTING W REFLEX): HIV SCREEN 4TH GENERATION: NONREACTIVE

## 2017-05-27 LAB — EXPECTORATED SPUTUM ASSESSMENT W REFEX TO RESP CULTURE

## 2017-05-27 LAB — STREP PNEUMONIAE URINARY ANTIGEN: STREP PNEUMO URINARY ANTIGEN: NEGATIVE

## 2017-05-27 MED ORDER — LEVALBUTEROL HCL 1.25 MG/0.5ML IN NEBU
1.2500 mg | INHALATION_SOLUTION | Freq: Four times a day (QID) | RESPIRATORY_TRACT | Status: DC
  Administered 2017-05-27 (×3): 1.25 mg via RESPIRATORY_TRACT
  Filled 2017-05-27 (×3): qty 0.5

## 2017-05-27 MED ORDER — ALBUTEROL SULFATE (2.5 MG/3ML) 0.083% IN NEBU
5.0000 mg | INHALATION_SOLUTION | Freq: Once | RESPIRATORY_TRACT | Status: AC
  Administered 2017-05-27: 5 mg via RESPIRATORY_TRACT
  Filled 2017-05-27: qty 6

## 2017-05-27 MED ORDER — NICOTINE 21 MG/24HR TD PT24
21.0000 mg | MEDICATED_PATCH | Freq: Every day | TRANSDERMAL | Status: DC | PRN
  Administered 2017-05-27 – 2017-05-28 (×2): 21 mg via TRANSDERMAL
  Filled 2017-05-27 (×3): qty 1

## 2017-05-27 MED ORDER — METHYLPREDNISOLONE SODIUM SUCC 125 MG IJ SOLR
60.0000 mg | Freq: Three times a day (TID) | INTRAMUSCULAR | Status: AC
  Administered 2017-05-27 (×2): 60 mg via INTRAVENOUS
  Filled 2017-05-27 (×2): qty 2

## 2017-05-27 MED ORDER — PREDNISONE 20 MG PO TABS
60.0000 mg | ORAL_TABLET | Freq: Once | ORAL | Status: AC
  Administered 2017-05-27: 60 mg via ORAL
  Filled 2017-05-27: qty 3

## 2017-05-27 MED ORDER — ENOXAPARIN SODIUM 40 MG/0.4ML ~~LOC~~ SOLN
40.0000 mg | SUBCUTANEOUS | Status: DC
  Filled 2017-05-27: qty 0.4

## 2017-05-27 MED ORDER — ALUM & MAG HYDROXIDE-SIMETH 200-200-20 MG/5ML PO SUSP: 30.0000 mL | ORAL | Status: DC | PRN

## 2017-05-27 MED ORDER — ACETAMINOPHEN 325 MG PO TABS: 650.0000 mg | ORAL_TABLET | Freq: Four times a day (QID) | ORAL | Status: DC | PRN

## 2017-05-27 MED ORDER — ESOMEPRAZOLE MAGNESIUM 20 MG PO PACK: 20.0000 mg | PACK | Freq: Every day | ORAL | Status: DC

## 2017-05-27 MED ORDER — ADULT MULTIVITAMIN W/MINERALS CH
1.0000 | ORAL_TABLET | Freq: Every day | ORAL | Status: DC
  Administered 2017-05-27 – 2017-05-28 (×2): 1 via ORAL
  Filled 2017-05-27 (×2): qty 1

## 2017-05-27 MED ORDER — IOPAMIDOL (ISOVUE-370) INJECTION 76%
INTRAVENOUS | Status: AC
  Administered 2017-05-27: 100 mL via INTRAVENOUS
  Filled 2017-05-27: qty 100

## 2017-05-27 MED ORDER — CHLORHEXIDINE GLUCONATE 0.12 % MT SOLN
15.0000 mL | Freq: Two times a day (BID) | OROMUCOSAL | Status: DC
  Administered 2017-05-27 – 2017-05-28 (×3): 15 mL via OROMUCOSAL
  Filled 2017-05-27 (×2): qty 15

## 2017-05-27 MED ORDER — ACETAMINOPHEN 325 MG PO TABS
650.0000 mg | ORAL_TABLET | Freq: Once | ORAL | Status: AC
  Administered 2017-05-27: 650 mg via ORAL
  Filled 2017-05-27: qty 2

## 2017-05-27 MED ORDER — QUETIAPINE FUMARATE 50 MG PO TABS
50.0000 mg | ORAL_TABLET | Freq: Every day | ORAL | Status: DC
  Administered 2017-05-27: 50 mg via ORAL
  Filled 2017-05-27: qty 1

## 2017-05-27 MED ORDER — ZOLPIDEM TARTRATE 5 MG PO TABS: 5.0000 mg | ORAL_TABLET | Freq: Every evening | ORAL | Status: DC | PRN

## 2017-05-27 MED ORDER — IPRATROPIUM BROMIDE 0.02 % IN SOLN: 0.5000 mg | RESPIRATORY_TRACT | Status: DC

## 2017-05-27 MED ORDER — IPRATROPIUM BROMIDE 0.02 % IN SOLN
0.5000 mg | Freq: Four times a day (QID) | RESPIRATORY_TRACT | Status: DC
  Administered 2017-05-27 (×3): 0.5 mg via RESPIRATORY_TRACT
  Filled 2017-05-27 (×3): qty 2.5

## 2017-05-27 MED ORDER — IBUPROFEN 200 MG PO TABS
600.0000 mg | ORAL_TABLET | Freq: Four times a day (QID) | ORAL | Status: DC | PRN
  Administered 2017-05-27: 600 mg via ORAL
  Filled 2017-05-27: qty 3

## 2017-05-27 MED ORDER — LEVOFLOXACIN IN D5W 500 MG/100ML IV SOLN
500.0000 mg | Freq: Once | INTRAVENOUS | Status: AC
  Administered 2017-05-27: 500 mg via INTRAVENOUS
  Filled 2017-05-27: qty 100

## 2017-05-27 MED ORDER — ESOMEPRAZOLE MAGNESIUM 20 MG PO CPDR
20.0000 mg | DELAYED_RELEASE_CAPSULE | Freq: Every day | ORAL | Status: DC
  Administered 2017-05-27 – 2017-05-28 (×2): 20 mg via ORAL
  Filled 2017-05-27 (×2): qty 1

## 2017-05-27 MED ORDER — LOPERAMIDE HCL 2 MG PO CAPS: 4.0000 mg | ORAL_CAPSULE | Freq: Four times a day (QID) | ORAL | Status: DC | PRN

## 2017-05-27 MED ORDER — MOMETASONE FURO-FORMOTEROL FUM 200-5 MCG/ACT IN AERO
2.0000 | INHALATION_SPRAY | Freq: Two times a day (BID) | RESPIRATORY_TRACT | Status: DC
  Administered 2017-05-27 – 2017-05-28 (×3): 2 via RESPIRATORY_TRACT
  Filled 2017-05-27: qty 8.8

## 2017-05-27 MED ORDER — LEVALBUTEROL HCL 1.25 MG/0.5ML IN NEBU
1.2500 mg | INHALATION_SOLUTION | Freq: Three times a day (TID) | RESPIRATORY_TRACT | Status: DC
  Administered 2017-05-28 (×2): 1.25 mg via RESPIRATORY_TRACT
  Filled 2017-05-27 (×2): qty 0.5

## 2017-05-27 MED ORDER — SODIUM CHLORIDE 0.9 % IV BOLUS (SEPSIS)
2500.0000 mL | Freq: Once | INTRAVENOUS | Status: AC
  Administered 2017-05-27: 2500 mL via INTRAVENOUS

## 2017-05-27 MED ORDER — EPINEPHRINE 0.3 MG/0.3ML IJ SOAJ: 0.3000 mg | INTRAMUSCULAR | Status: DC | PRN

## 2017-05-27 MED ORDER — LEVOFLOXACIN IN D5W 250 MG/50ML IV SOLN
250.0000 mg | Freq: Once | INTRAVENOUS | Status: AC
  Administered 2017-05-27: 250 mg via INTRAVENOUS
  Filled 2017-05-27: qty 50

## 2017-05-27 MED ORDER — IPRATROPIUM BROMIDE 0.02 % IN SOLN
0.5000 mg | Freq: Once | RESPIRATORY_TRACT | Status: AC
  Administered 2017-05-27: 0.5 mg via RESPIRATORY_TRACT
  Filled 2017-05-27: qty 2.5

## 2017-05-27 MED ORDER — IOPAMIDOL (ISOVUE-370) INJECTION 76%
100.0000 mL | Freq: Once | INTRAVENOUS | Status: AC | PRN
  Administered 2017-05-27: 100 mL via INTRAVENOUS

## 2017-05-27 MED ORDER — PREDNISONE 20 MG PO TABS
40.0000 mg | ORAL_TABLET | Freq: Every day | ORAL | Status: DC
  Administered 2017-05-28: 40 mg via ORAL
  Filled 2017-05-27: qty 2

## 2017-05-27 MED ORDER — ORAL CARE MOUTH RINSE
15.0000 mL | Freq: Two times a day (BID) | OROMUCOSAL | Status: DC
  Administered 2017-05-27 (×2): 15 mL via OROMUCOSAL

## 2017-05-27 MED ORDER — LORATADINE 10 MG PO TABS
10.0000 mg | ORAL_TABLET | Freq: Every day | ORAL | Status: DC
  Administered 2017-05-27 – 2017-05-28 (×2): 10 mg via ORAL
  Filled 2017-05-27 (×2): qty 1

## 2017-05-27 MED ORDER — METHYLPREDNISOLONE SODIUM SUCC 125 MG IJ SOLR: 60.0000 mg | Freq: Three times a day (TID) | INTRAMUSCULAR | Status: DC

## 2017-05-27 MED ORDER — METHOCARBAMOL 500 MG PO TABS: 500.0000 mg | ORAL_TABLET | Freq: Three times a day (TID) | ORAL | Status: DC | PRN

## 2017-05-27 MED ORDER — ONDANSETRON HCL 4 MG/2ML IJ SOLN: 4.0000 mg | Freq: Three times a day (TID) | INTRAMUSCULAR | Status: DC | PRN

## 2017-05-27 MED ORDER — IBUPROFEN 200 MG PO TABS: 400.0000 mg | ORAL_TABLET | Freq: Four times a day (QID) | ORAL | Status: DC | PRN

## 2017-05-27 MED ORDER — IPRATROPIUM BROMIDE 0.02 % IN SOLN
0.5000 mg | Freq: Three times a day (TID) | RESPIRATORY_TRACT | Status: DC
  Administered 2017-05-28 (×2): 0.5 mg via RESPIRATORY_TRACT
  Filled 2017-05-27 (×2): qty 2.5

## 2017-05-27 MED ORDER — LEVOFLOXACIN IN D5W 750 MG/150ML IV SOLN
750.0000 mg | INTRAVENOUS | Status: DC
  Administered 2017-05-28: 750 mg via INTRAVENOUS
  Filled 2017-05-27: qty 150

## 2017-05-27 MED ORDER — LORAZEPAM 0.5 MG PO TABS: 0.5000 mg | ORAL_TABLET | Freq: Three times a day (TID) | ORAL | Status: DC | PRN

## 2017-05-27 MED ORDER — SODIUM CHLORIDE 0.9 % IV SOLN
INTRAVENOUS | Status: DC
  Administered 2017-05-27 (×2): via INTRAVENOUS

## 2017-05-27 MED ORDER — GUAIFENESIN ER 600 MG PO TB12
600.0000 mg | ORAL_TABLET | Freq: Two times a day (BID) | ORAL | Status: DC
  Administered 2017-05-27 – 2017-05-28 (×3): 600 mg via ORAL
  Filled 2017-05-27 (×3): qty 1

## 2017-05-27 NOTE — H&P (Signed)
History and Physical    George Caldwellustin Krah WUJ:[id number] DOB: 12/23/1995 DOA: 05/27/2017  Referring MD/NP/PA:   PCP: Ronnette JuniperPringle, Joseph, MD   Patient coming from:  The patient is coming from [facility].  At baseline, pt is independent for most of ADL.   Chief Complaint: Cough and shortness of breath  HPI: George Becker is a 21 y.o. male with medical history significant of polysubstance abuse (tobacco, heroin and Methylphenidate), asthma, GERD, depression, anxiety, who presents with cough and shortness of breath.  Pt states that he has been doing detox in [facility] in the the past 2 weeks. He is taking prn Tylenol and ibuprofen, and prn muscle relaxer. In the past 5 days, he has been having cough and SOB, which has been progressively getting worse. He coughs up yellow colored sputum. He speaks in full sentence. He has chills and low-grade fever. Denies chest pain. Patient is treated with prednisone in the past 4 days, and started with Z-Pak yesterday, but no significant improvement. He does not have nausea, vomiting, diarrhea, abdominal pain, symptoms of UTI or unilateral weakness.  ED Course: pt was found to have WBC 12.5, negative flu PCR, electrolytes renal function okay, temperature normal, soft blood pressure, tachycardia, tachypnea, oxygen saturation 86% on room air. CT angiogram is negative for PE, but showed thickening of the central airway is with nodular and tree-in-bud opacities of both lung bases and the inferior lingula. Pt is admitted to telemetry bed as inpatient.  Review of Systems:   General: has fevers, chills, no body weight gain, has poor appetite, has fatigue HEENT: no blurry vision, hearing changes or sore throat Respiratory: has dyspnea, coughing, wheezing CV: no chest pain, no palpitations GI: no nausea, vomiting, abdominal pain, diarrhea, constipation GU: no dysuria, burning on urination, increased urinary frequency, hematuria  Ext: no leg edema Neuro: no  unilateral weakness, numbness, or tingling, no vision change or hearing loss Skin: no rash, no skin tear. MSK: No muscle spasm, no deformity, no limitation of range of movement in spin Heme: No easy bruising.  Travel history: No recent long distant travel.  Allergy:  Allergies  Allergen Reactions  . Amoxicillin Anaphylaxis and Hives  . Augmentin [Amoxicillin-Pot Clavulanate] Anaphylaxis and Hives  . Other Anaphylaxis    "all Nuts"  . Peanut Butter Flavor     Past Medical History:  Diagnosis Date  . Anxiety   . Asthma     Past Surgical History:  Procedure Laterality Date  . NO PAST SURGERIES      Social History:  reports that he has been smoking cigarettes.  He has been smoking about 1.00 pack per day. he has never used smokeless tobacco. He reports that he drinks alcohol. He reports that he uses drugs. Drugs: Heroin and Methylphenidate.  Family History:  Family History  Problem Relation Age of Onset  . Diabetes Mother   . Hypertension Father   . Asthma Maternal Grandfather      Prior to Admission medications   Medication Sig Start Date End Date Taking? Authorizing Provider  albuterol (PROVENTIL HFA;VENTOLIN HFA) 108 (90 Base) MCG/ACT inhaler Inhale 2 puffs into the lungs every 6 (six) hours as needed for wheezing or shortness of breath. 10/05/15  Yes Enedina FinnerPatel, Sona, MD  albuterol (PROVENTIL) (2.5 MG/3ML) 0.083% nebulizer solution Take 2.5 mg by nebulization every 8 (eight) hours as needed for wheezing or shortness of breath.   Yes [provider]  alum & mag hydroxide-simeth (MAALOX/MYLANTA) 200-200-20 MG/5ML suspension Take 30-60 mLs by  mouth as needed for indigestion or heartburn.   Yes [provider]  azithromycin (ZITHROMAX) 250 MG tablet Take 250 mg by mouth daily. 2 tablets on day 1, then 1 tablet daily for 5 days   Yes [provider]  benzocaine-menthol (CHLORAEPTIC) 6-10 MG lozenge Take 1 lozenge by mouth as needed for sore throat.   Yes  [provider]  benzonatate (TESSALON) 100 MG capsule Take 200 mg by mouth 3 (three) times daily as needed for cough.   Yes [provider]  busPIRone (BUSPAR) 5 MG tablet Take 5 mg by mouth 2 (two) times daily.   Yes [provider]  cetirizine (ZYRTEC) 10 MG tablet Take 10 mg by mouth daily as needed for allergies.   Yes [provider]  EPINEPHrine 0.3 mg/0.3 mL IJ SOAJ injection Inject 0.3 mg into the muscle once.   Yes [provider]  esomeprazole (NEXIUM) 20 MG packet Take 20 mg by mouth daily before breakfast.   Yes [provider]  Fluticasone-Salmeterol (ADVAIR DISKUS) 250-50 MCG/DOSE AEPB Inhale 1 puff into the lungs 2 (two) times daily. 06/06/16 06/06/17 Yes KasaWallis Bamberg, MD  Fructose-Dextrose-Phosphor Acd (NAUSEA CONTROL) 1.87-1.87-21.5 SOLN Take 15-30 mLs by mouth every 15 (fifteen) minutes as needed (nausea).   Yes [provider]  guaiFENesin (MUCINEX) 600 MG 12 hr tablet Take 600 mg by mouth 2 (two) times daily.   Yes [provider]  ibuprofen (ADVIL,MOTRIN) 200 MG tablet Take 600 mg by mouth every 6 (six) hours as needed for moderate pain.    Yes [provider]  loperamide (IMODIUM A-D) 2 MG tablet Take 4 mg by mouth 4 (four) times daily as needed for diarrhea or loose stools.   Yes [provider]  Multiple Vitamin (MULTIVITAMIN WITH MINERALS) TABS tablet Take 1 tablet by mouth daily.   Yes [provider]  nicotine (NICODERM CQ - DOSED IN MG/24 HOURS) 21 mg/24hr patch Place 21 mg onto the skin daily as needed (cravings).   Yes [provider]  nicotine polacrilex (NICORETTE) 2 MG gum Take 2 mg by mouth as needed for smoking cessation.   Yes [provider]  ondansetron (ZOFRAN) 8 MG tablet Take 8 mg by mouth 2 (two) times daily as needed for nausea or vomiting.   Yes [provider]  QUEtiapine (SEROQUEL) 50 MG tablet Take 50 mg by mouth at bedtime.   Yes  [provider]  diltiazem (CARDIZEM) 60 MG tablet Take 1 tablet (60 mg total) by mouth 4 (four) times daily as needed. 05/21/17   RevankarAundra Dubin, MD    Physical Exam: Vitals:   05/27/17 0530 05/27/17 0600 05/27/17 0603 05/27/17 0630  BP: 105/67 113/70  116/60  Pulse:  88  (!) 110  Resp: 17 15  (!) 24  Temp:   98.3 F (36.8 C)   TempSrc:   Oral   SpO2:  97%  99%  Weight:      Height:       General: Not in acute distress HEENT:       Eyes: PERRL, EOMI, no scleral icterus.       ENT: No discharge from the ears and nose, no pharynx injection, no tonsillar enlargement.        Neck: No JVD, no bruit, no mass felt. Heme: No neck lymph node enlargement. Cardiac: S1/S2, RRR, No murmurs, No gallops or rubs. Respiratory: Has wheezing bilaterally.  GI: Soft, nondistended, nontender, no rebound pain, no organomegaly,  BS present. GU: No hematuria Ext: No pitting leg edema bilaterally. 2+DP/PT pulse bilaterally. Musculoskeletal: No joint deformities, No joint redness or warmth, no limitation of ROM in spin. Skin: No rashes.  Neuro: Alert, oriented X3, cranial nerves II-XII grossly intact, moves all extremities normally.  Psych: Patient is not psychotic, no suicidal or hemocidal ideation.  Labs on Admission: I have personally reviewed following labs and imaging studies  CBC: Recent Labs  Lab 05/27/17 0353  WBC 12.5*  HGB 12.7*  HCT 37.2*  MCV 88.4  PLT 199   Basic Metabolic Panel: Recent Labs  Lab 05/27/17 0353  NA 138  K 3.6  CL 103  CO2 27  GLUCOSE 119*  BUN 11  CREATININE 0.66  CALCIUM 8.4*   GFR: Estimated Creatinine Clearance: 149.4 mL/min (by C-G formula based on SCr of 0.66 mg/dL). Liver Function Tests: No results for input(s): AST, ALT, ALKPHOS, BILITOT, PROT, ALBUMIN in the last 168 hours. No results for input(s): LIPASE, AMYLASE in the last 168 hours. No results for input(s): AMMONIA in the last 168 hours. Coagulation Profile: No results for  input(s): INR, PROTIME in the last 168 hours. Cardiac Enzymes: No results for input(s): CKTOTAL, CKMB, CKMBINDEX, TROPONINI in the last 168 hours. BNP (last 3 results) No results for input(s): PROBNP in the last 8760 hours. HbA1C: No results for input(s): HGBA1C in the last 72 hours. CBG: No results for input(s): GLUCAP in the last 168 hours. Lipid Profile: No results for input(s): CHOL, HDL, LDLCALC, TRIG, CHOLHDL, LDLDIRECT in the last 72 hours. Thyroid Function Tests: No results for input(s): TSH, T4TOTAL, FREET4, T3FREE, THYROIDAB in the last 72 hours. Anemia Panel: No results for input(s): VITAMINB12, FOLATE, FERRITIN, TIBC, IRON, RETICCTPCT in the last 72 hours. Urine analysis:    Component Value Date/Time   COLORURINE YELLOW (A) 10/03/2015 2218   APPEARANCEUR CLEAR (A) 10/03/2015 2218   LABSPEC 1.019 10/03/2015 2218   PHURINE 5.0 10/03/2015 2218   GLUCOSEU NEGATIVE 10/03/2015 2218   HGBUR NEGATIVE 10/03/2015 2218   BILIRUBINUR NEGATIVE 10/03/2015 2218   KETONESUR NEGATIVE 10/03/2015 2218   PROTEINUR NEGATIVE 10/03/2015 2218   NITRITE NEGATIVE 10/03/2015 2218   LEUKOCYTESUR NEGATIVE 10/03/2015 2218   Sepsis Labs: @LABRCNTIP (procalcitonin:4,lacticidven:4) )No results found for this or any previous visit (from the past 240 hour(s)).   Radiological Exams on Admission: Dg Chest 2 View  Result Date: 05/27/2017 CLINICAL DATA:  Shortness of breath and cough EXAM: CHEST  2 VIEW COMPARISON:  04/08/2017 FINDINGS: Hyperinflation with with central airways thickening. No consolidation or effusion. Normal heart size. No pneumothorax. IMPRESSION: Hyperinflation with central airways thickening which may be secondary to viral process, reactive airways or bronchitis. Electronically Signed   By: Jasmine PangKim  Fujinaga M.D.   On: 05/27/2017 01:32   Ct Angio Chest Pe W And/or Wo Contrast  Result Date: 05/27/2017 CLINICAL DATA:  Shortness of breath EXAM: CT ANGIOGRAPHY CHEST WITH CONTRAST  TECHNIQUE: Multidetector CT imaging of the chest was performed using the standard protocol during bolus administration of intravenous contrast. Multiplanar CT image reconstructions and MIPs were obtained to evaluate the vascular anatomy. CONTRAST:  100 mL Isovue 370 COMPARISON:  Chest radiograph 05/27/2017 FINDINGS: Cardiovascular: Contrast injection is sufficient to demonstrate satisfactory opacification of the pulmonary arteries to the segmental level. There is no pulmonary embolus. The main pulmonary artery is within normal limits for size. There is no CT evidence of acute right heart strain. The visualized aorta is normal. There is a normal 3-vessel arch branching pattern. Heart size  is normal, without pericardial effusion. Mediastinum/Nodes: No mediastinal, hilar or axillary lymphadenopathy. The visualized thyroid and thoracic esophageal course are unremarkable. Lungs/Pleura: There is thickening of the central airways, particularly at the lobar and segmental levels. No focal airspace consolidation. There are multiple nodular and tree-in-bud opacities in the inferior lingula and both lower lobes, right worse than left. No pleural effusion. Upper Abdomen: Contrast bolus timing is not optimized for evaluation of the abdominal organs. Within this limitation, the visualized organs of the upper abdomen are normal. Musculoskeletal: No chest wall abnormality. No acute or significant osseous findings. Review of the MIP images confirms the above findings. IMPRESSION: 1. No pulmonary embolus or acute aortic syndrome. 2. Thickening of the central airway is with nodular and tree-in-bud opacities of both lung bases and the inferior lingula. These findings may be secondary to a viral infectious process or to aspiration. Reactive airway disease could cause the peribronchial thickening, but would not be expected to cause the tree-in-bud opacities. Electronically Signed   By: Deatra Robinson M.D.   On: 05/27/2017 05:30      EKG:  Not done in ED, will get one.   Assessment/Plan Principal Problem:   Acute respiratory failure with hypoxia (HCC) Active Problems:   Asthma exacerbation   Polysubstance abuse (HCC)   Continuous dependence on cigarette smoking   Sepsis (HCC)   Lobar pneumonia (HCC)   Depression with anxiety   Acute respiratory failure with hypoxia and sepsis: Most likely due to asthma exacerbation given wheezing on auscultation, but cannot completely rule out lobar pneumonia given CT angiogram findings. Patient admits [facility] for sepsis with leukocytosis, tachycardia and tachypnea. Currently hemodynamically stable.  -will admit patient to telemetry bed  --Nebulizers: scheduled Atrovent and prn Xopenex Nebs -Solu-Medrol 60 mg IV tid  -IV levaquin -Mucinex for cough  --Incentive spirometry -Urine legionella and S. pneumococcal antigen -Follow up blood culture x2, sputum culture, respiratory virus panel -Nasal cannula oxygen as needed to maintain O2 saturation 92% or greater -will get Procalcitonin and trend lactic acid levels per sepsis protocol. -IVF: 2.5L of NS bolus in ED, followed by 125 cc/h   Asthma exacerbation and possible Lobar pneumonia: -see above  Polysubstance abuse (HCC): including tobacco, heroin and Methylphenidate. Currently doing detox in [facility]. -did counseling about importance of quitting substance -Nicotine patch  -Check UDS  -prn ibuprofen or Tylenol for pain -When necessary Robaxin for back pain and muscle spasm  Depression with anxiety: -continue Seroquel -When necessary  GERD: -Nexium   DVT ppx:   SQ Lovenox Code Status: Full code Family Communication: Yes, patient's mother at bed side Disposition Plan:  Anticipate discharge back to [facility] Consults called:  none Admission status:  Inpatient/tele      Date of Service 05/27/2017    Lorretta Harp [facility] Pager [phone]  If 7PM-7AM, please contact  night-coverage [email or web address] Password [facility] 05/27/2017, 6:46 AM

## 2017-05-27 NOTE — ED Provider Notes (Signed)
[facility]-EMERGENCY DEPT Provider Note   CSN: [id number] Arrival date & time: 05/27/17  0000     History   Chief Complaint Chief Complaint  Patient presents with  . Shortness of Breath    HPI George Becker is a 21 y.o. male.  HPI  21 year old male with a history of asthma who presents emergency department with increasing shortness of breath over the past 5 days.  [facility].  He was started on azithromycin tonight after he was having increasing shortness of breath.  No improvement with his albuterol.  He presents complaining of shortness of breath without significant chest pain.  No history of DVT or pulmonary embolism.  Reports productive cough.  Shortness of breath at rest.  Reports symptoms are moderate to severe at this time.  Past Medical History:  Diagnosis Date  . Anxiety   . Asthma     Patient Active Problem List   Diagnosis Date Noted  . Palpitations 05/21/2017  . Continuous dependence on cigarette smoking 05/21/2017  . Substance induced mood disorder (HCC) 03/16/2017  . Asthma 01/17/2016  . Asthma exacerbation 10/04/2015  . Polysubstance abuse (HCC) 10/04/2015    Past Surgical History:  Procedure Laterality Date  . NO PAST SURGERIES         Home Medications    Prior to Admission medications   Medication Sig Start Date End Date Taking? Authorizing Provider  albuterol (PROVENTIL HFA;VENTOLIN HFA) 108 (90 Base) MCG/ACT inhaler Inhale 2 puffs into the lungs every 6 (six) hours as needed for wheezing or shortness of breath. 10/05/15  Yes Enedina FinnerPatel, Sona, MD  albuterol (PROVENTIL) (2.5 MG/3ML) 0.083% nebulizer solution Take 2.5 mg by nebulization every 8 (eight) hours as needed for wheezing or shortness of breath.   Yes [provider]  alum & mag hydroxide-simeth (MAALOX/MYLANTA) 200-200-20 MG/5ML suspension Take 30-60 mLs by mouth as needed for indigestion  or heartburn.   Yes [provider]  azithromycin (ZITHROMAX) 250 MG tablet Take 250 mg by mouth daily. 2 tablets on day 1, then 1 tablet daily for 5 days   Yes [provider]  benzocaine-menthol (CHLORAEPTIC) 6-10 MG lozenge Take 1 lozenge by mouth as needed for sore throat.   Yes [provider]  benzonatate (TESSALON) 100 MG capsule Take 200 mg by mouth 3 (three) times daily as needed for cough.   Yes [provider]  busPIRone (BUSPAR) 5 MG tablet Take 5 mg by mouth 2 (two) times daily.   Yes [provider]  cetirizine (ZYRTEC) 10 MG tablet Take 10 mg by mouth daily as needed for allergies.   Yes [provider]  EPINEPHrine 0.3 mg/0.3 mL IJ SOAJ injection Inject 0.3 mg into the muscle once.   Yes [provider]  esomeprazole (NEXIUM) 20 MG packet Take 20 mg by mouth daily before breakfast.   Yes [provider]  Fluticasone-Salmeterol (ADVAIR DISKUS) 250-50 MCG/DOSE AEPB Inhale 1 puff into the lungs 2 (two) times daily. 06/06/16 06/06/17 Yes KasaWallis Bamberg, Kurian, MD  Fructose-Dextrose-Phosphor Acd (NAUSEA CONTROL) 1.87-1.87-21.5 SOLN Take 15-30 mLs by mouth every 15 (fifteen) minutes as needed (nausea).   Yes [provider]  guaiFENesin (MUCINEX) 600 MG 12 hr tablet Take 600 mg by mouth 2 (two) times daily.   Yes [provider]  ibuprofen (ADVIL,MOTRIN) 200 MG tablet Take 600 mg by mouth every 6 (six) hours as needed for moderate pain.  Yes [provider]  loperamide (IMODIUM A-D) 2 MG tablet Take 4 mg by mouth 4 (four) times daily as needed for diarrhea or loose stools.   Yes [provider]  Multiple Vitamin (MULTIVITAMIN WITH MINERALS) TABS tablet Take 1 tablet by mouth daily.   Yes [provider]  nicotine (NICODERM CQ - DOSED IN MG/24 HOURS) 21 mg/24hr patch Place 21 mg onto the skin daily as needed (cravings).   Yes [provider]  nicotine polacrilex (NICORETTE) 2  MG gum Take 2 mg by mouth as needed for smoking cessation.   Yes [provider]  ondansetron (ZOFRAN) 8 MG tablet Take 8 mg by mouth 2 (two) times daily as needed for nausea or vomiting.   Yes [provider]  QUEtiapine (SEROQUEL) 50 MG tablet Take 50 mg by mouth at bedtime.   Yes [provider]  diltiazem (CARDIZEM) 60 MG tablet Take 1 tablet (60 mg total) by mouth 4 (four) times daily as needed. 05/21/17   Revankar, Aundra Dubinajan R, MD    Family History Family History  Problem Relation Age of Onset  . Diabetes Mother   . Hypertension Father   . Asthma Maternal Grandfather     Social History Social History   Tobacco Use  . Smoking status: Current Every Day Smoker    Packs/day: 1.00    Types: Cigarettes  . Smokeless tobacco: Never Used  Substance Use Topics  . Alcohol use: Yes    Alcohol/week: 0.0 oz  . Drug use: Yes    Types: Heroin, Methylphenidate    Comment: Treatment for 10 days      Allergies   Amoxicillin; Augmentin [amoxicillin-pot clavulanate]; Other; and Peanut butter flavor   Review of Systems Review of Systems  All other systems reviewed and are negative.    Physical Exam Updated Vital Signs BP 114/69   Pulse (!) 112   Temp 98.3 F (36.8 C) (Oral)   Resp (!) 22   Ht 5\' 11"  (1.803 m)   Wt 71.7 kg (158 lb)   SpO2 98%   BMI 22.04 kg/m   Physical Exam  Constitutional: He is oriented to person, place, and time. He appears well-developed and well-nourished.  HENT:  Head: Normocephalic and atraumatic.  Eyes: EOM are normal.  Neck: Normal range of motion.  Cardiovascular: Regular rhythm, normal heart sounds and intact distal pulses.  Tachycardic  Pulmonary/Chest: Effort normal. No respiratory distress. He has wheezes.  Abdominal: Soft. He exhibits no distension. There is no tenderness.  Musculoskeletal: Normal range of motion.  Neurological: He is alert and oriented to person, place, and time.  Skin: Skin is warm and dry.    Psychiatric: He has a normal mood and affect. Judgment normal.  Nursing note and vitals reviewed.    ED Treatments / Results  Labs (all labs ordered are listed, but only abnormal results are displayed) Labs Reviewed  CBC - Abnormal; Notable for the following components:      Result Value   WBC 12.5 (*)    RBC 4.21 (*)    Hemoglobin 12.7 (*)    HCT 37.2 (*)    All other components within normal limits  BASIC METABOLIC PANEL - Abnormal; Notable for the following components:   Glucose, Bld 119 (*)    Calcium 8.4 (*)    All other components within normal limits  INFLUENZA PANEL BY PCR (TYPE A & B)    EKG  EKG Interpretation None  Radiology Dg Chest 2 View  Result Date: 05/27/2017 CLINICAL DATA:  Shortness of breath and cough EXAM: CHEST  2 VIEW COMPARISON:  04/08/2017 FINDINGS: Hyperinflation with with central airways thickening. No consolidation or effusion. Normal heart size. No pneumothorax. IMPRESSION: Hyperinflation with central airways thickening which may be secondary to viral process, reactive airways or bronchitis. Electronically Signed   By: Jasmine Pang M.D.   On: 05/27/2017 01:32   Ct Angio Chest Pe W And/or Wo Contrast  Result Date: 05/27/2017 CLINICAL DATA:  Shortness of breath EXAM: CT ANGIOGRAPHY CHEST WITH CONTRAST TECHNIQUE: Multidetector CT imaging of the chest was performed using the standard protocol during bolus administration of intravenous contrast. Multiplanar CT image reconstructions and MIPs were obtained to evaluate the vascular anatomy. CONTRAST:  100 mL Isovue 370 COMPARISON:  Chest radiograph 05/27/2017 FINDINGS: Cardiovascular: Contrast injection is sufficient to demonstrate satisfactory opacification of the pulmonary arteries to the segmental level. There is no pulmonary embolus. The main pulmonary artery is within normal limits for size. There is no CT evidence of acute right heart strain. The visualized aorta is normal. There is a normal  3-vessel arch branching pattern. Heart size is normal, without pericardial effusion. Mediastinum/Nodes: No mediastinal, hilar or axillary lymphadenopathy. The visualized thyroid and thoracic esophageal course are unremarkable. Lungs/Pleura: There is thickening of the central airways, particularly at the lobar and segmental levels. No focal airspace consolidation. There are multiple nodular and tree-in-bud opacities in the inferior lingula and both lower lobes, right worse than left. No pleural effusion. Upper Abdomen: Contrast bolus timing is not optimized for evaluation of the abdominal organs. Within this limitation, the visualized organs of the upper abdomen are normal. Musculoskeletal: No chest wall abnormality. No acute or significant osseous findings. Review of the MIP images confirms the above findings. IMPRESSION: 1. No pulmonary embolus or acute aortic syndrome. 2. Thickening of the central airway is with nodular and tree-in-bud opacities of both lung bases and the inferior lingula. These findings may be secondary to a viral infectious process or to aspiration. Reactive airway disease could cause the peribronchial thickening, but would not be expected to cause the tree-in-bud opacities. Electronically Signed   By: Deatra Robinson M.D.   On: 05/27/2017 05:30    Procedures Procedures (including critical care time)  Medications Ordered in ED Medications  albuterol (PROVENTIL) (2.5 MG/3ML) 0.083% nebulizer solution 5 mg (not administered)  ipratropium (ATROVENT) nebulizer solution 0.5 mg (not administered)  levofloxacin (LEVAQUIN) IVPB 500 mg (not administered)  predniSONE (DELTASONE) tablet 60 mg (60 mg Oral Given 05/27/17 0138)  albuterol (PROVENTIL) (2.5 MG/3ML) 0.083% nebulizer solution 5 mg (5 mg Nebulization Given 05/27/17 0138)  ipratropium (ATROVENT) nebulizer solution 0.5 mg (0.5 mg Nebulization Given 05/27/17 0139)  acetaminophen (TYLENOL) tablet 650 mg (650 mg Oral Given 05/27/17 0238)    iopamidol (ISOVUE-370) 76 % injection 100 mL (100 mLs Intravenous Contrast Given 05/27/17 0453)     Initial Impression / Assessment and Plan / ED Course  I have reviewed the triage vital signs and the nursing notes.  Pertinent labs & imaging results that were available during my care of the patient were reviewed by me and considered in my medical decision making (see chart for details).     Mild improvement with bronchodilators.  Continues to be tachycardic.  CT imaging of the chest obtained which demonstrates tree-in-bud presentation which could represent viral pneumonia.  Patient be started on Levaquin.  Given his hypoxia down to 86% and his improvement with oxygen therapy he  will be admitted the hospital overnight for ongoing management and treatment of his symptoms  Final Clinical Impressions(s) / ED Diagnoses   Final diagnoses:  None    ED Discharge Orders    None       Azalia Bilis, MD 05/27/17 [phone]

## 2017-05-27 NOTE — ED Notes (Signed)
New room 1422

## 2017-05-27 NOTE — Progress Notes (Signed)
PROGRESS NOTE    George Becker  ZOX:[id number] DOB: [birth year] DOA: 05/27/2017 PCP: Ronnette Juniper, MD   Brief Narrative: George Becker is George Becker 21 y.o. male with medical history significant of polysubstance abuse (tobacco, heroin and Methylphenidate), asthma, GERD, depression, anxiety, who presents with cough and shortness of breath.  Pt states that he has been doing detox in [facility] in the the past 2 weeks. He is taking prn Tylenol and ibuprofen, and prn muscle relaxer. In the past 5 days, he has been having cough and SOB, which has been progressively getting worse. He coughs up yellow colored sputum. He speaks in full sentence. He has chills and low-grade fever. Denies chest pain. Patient is treated with prednisone in the past 4 days, and started with Z-Pak yesterday, but no significant improvement. He does not have nausea, vomiting, diarrhea, abdominal pain, symptoms of UTI or unilateral weakness.  Assessment & Plan:   Principal Problem:   Acute respiratory failure with hypoxia (HCC) Active Problems:   Asthma exacerbation   Polysubstance abuse (HCC)   Continuous dependence on cigarette smoking   Sepsis (HCC)   Lobar pneumonia (HCC)   Depression with anxiety   Acute respiratory failure with hypoxia and sepsis: Most likely due to asthma exacerbation given wheezing on auscultation, but cannot completely rule out lobar pneumonia given CT angiogram findings. Patient admits George Becker for sepsis with leukocytosis, tachycardia and tachypnea. Currently hemodynamically stable. -will admit patient to telemetry bed  --Nebulizers: scheduled Atrovent and prn Xopenex Nebs -Solu-Medrol 60 mg IV tid -> transition to PO prednisone tomorrow -IV levaquin -Mucinex for cough  --Incentive spirometry -Urine legionella and S. pneumococcal antigen -Follow up blood culture x2, sputum culture, respiratory virus panel -Nasal cannula oxygen as neededto maintain O2 saturation 92% or greater -will  get Procalcitonin and trend lactic acid levels per sepsis protocol. -IVF: 2.5L of NS bolus in ED, followed by 125 cc/h   Asthma exacerbation and possible Lobar pneumonia: -see above  Polysubstance abuse (HCC): including tobacco, heroin and Methylphenidate. Currently doing detox in [facility]. -counseled by previous provider -Nicotine patch  -Check UDS  -prn ibuprofen or Tylenol for pain -When necessary Robaxin for back pain and muscle spasm  Depression with anxiety: -continue Seroquel -When necessary ativan ordered, but will d/c for now  GERD: -Nexium  DVT prophylaxis: SCD and ambulation Code Status: full code Family Communication: mom at bedside Disposition Plan: pending improvement  Consultants:   none  Procedures: (Don't include imaging studies which can be auto populated. Include things that cannot be auto populated i.e. Echo, Carotid and venous dopplers, Foley, Bipap, HD, tubes/drains, wound vac, central lines etc)  none  Antimicrobials: (specify start and planned stop date. Auto populated tables are space occupying and do not give end dates)  levaquin 11/27 -     Subjective: Feeling better.  Was having SOB for past few days, but feels ok now.  Objective: Vitals:   05/27/17 0720 05/27/17 0909 05/27/17 1828 05/27/17 1936  BP: 122/60  128/75   Pulse: (!) 112  (!) 111   Resp: (!) 22     Temp: 98.8 F (37.1 C)  98.2 F (36.8 C)   TempSrc: Oral  Oral   SpO2: 98% 96% 97% 95%  Weight:      Height:        Intake/Output Summary (Last 24 hours) at 05/27/2017 2008 Last data filed at 05/27/2017 1700 Gross per 24 hour  Intake 995 ml  Output -  Net 995 ml  Filed Weights   05/27/17 0018  Weight: 71.7 kg (158 lb)    Examination:  General exam: Appears calm and comfortable  Respiratory system: Clear to auscultation. Respiratory effort normal. Cardiovascular system: S1 & S2 heard, RRR. No JVD, murmurs, rubs, gallops or clicks. No pedal  edema. Gastrointestinal system: Abdomen is nondistended, soft and nontender. No organomegaly or masses felt. Normal bowel sounds heard. Central nervous system: Alert and oriented. No focal neurological deficits. Extremities: Symmetric 5 x 5 power. Skin: No rashes, lesions or ulcers Psychiatry: Judgement and insight appear normal. Mood & affect appropriate.     Data Reviewed: I have personally reviewed following labs and imaging studies  CBC: Recent Labs  Lab 05/27/17 0353  WBC 12.5*  HGB 12.7*  HCT 37.2*  MCV 88.4  PLT 199   Basic Metabolic Panel: Recent Labs  Lab 05/27/17 0353  NA 138  K 3.6  CL 103  CO2 27  GLUCOSE 119*  BUN 11  CREATININE 0.66  CALCIUM 8.4*   GFR: Estimated Creatinine Clearance: 149.4 mL/min (by C-G formula based on SCr of 0.66 mg/dL). Liver Function Tests: No results for input(s): AST, ALT, ALKPHOS, BILITOT, PROT, ALBUMIN in the last 168 hours. No results for input(s): LIPASE, AMYLASE in the last 168 hours. No results for input(s): AMMONIA in the last 168 hours. Coagulation Profile: No results for input(s): INR, PROTIME in the last 168 hours. Cardiac Enzymes: No results for input(s): CKTOTAL, CKMB, CKMBINDEX, TROPONINI in the last 168 hours. BNP (last 3 results) No results for input(s): PROBNP in the last 8760 hours. HbA1C: No results for input(s): HGBA1C in the last 72 hours. CBG: No results for input(s): GLUCAP in the last 168 hours. Lipid Profile: No results for input(s): CHOL, HDL, LDLCALC, TRIG, CHOLHDL, LDLDIRECT in the last 72 hours. Thyroid Function Tests: No results for input(s): TSH, T4TOTAL, FREET4, T3FREE, THYROIDAB in the last 72 hours. Anemia Panel: No results for input(s): VITAMINB12, FOLATE, FERRITIN, TIBC, IRON, RETICCTPCT in the last 72 hours. Sepsis Labs: Recent Labs  Lab 05/27/17 0640 05/27/17 0728 05/27/17 1200 05/27/17 1419  PROCALCITON 0.47  --   --   --   LATICACIDVEN 1.2 3.7* 2.0* 1.9    Recent Results  (from the past 240 hour(s))  Respiratory Panel by PCR     Status: Abnormal   Collection Time: 05/27/17  6:24 AM  Result Value Ref Range Status   Adenovirus NOT DETECTED NOT DETECTED Final   Coronavirus 229E NOT DETECTED NOT DETECTED Final   Coronavirus HKU1 NOT DETECTED NOT DETECTED Final   Coronavirus NL63 NOT DETECTED NOT DETECTED Final   Coronavirus OC43 NOT DETECTED NOT DETECTED Final   Metapneumovirus NOT DETECTED NOT DETECTED Final   Rhinovirus / Enterovirus DETECTED (Gladine Plude) NOT DETECTED Final   Influenza Queena Monrreal NOT DETECTED NOT DETECTED Final   Influenza B NOT DETECTED NOT DETECTED Final   Parainfluenza Virus 1 NOT DETECTED NOT DETECTED Final   Parainfluenza Virus 2 NOT DETECTED NOT DETECTED Final   Parainfluenza Virus 3 NOT DETECTED NOT DETECTED Final   Parainfluenza Virus 4 NOT DETECTED NOT DETECTED Final   Respiratory Syncytial Virus NOT DETECTED NOT DETECTED Final   Bordetella pertussis NOT DETECTED NOT DETECTED Final   Chlamydophila pneumoniae NOT DETECTED NOT DETECTED Final   Mycoplasma pneumoniae NOT DETECTED NOT DETECTED Final    Comment: Performed at [facility], [address], [place], [place] [address]  Culture, sputum-assessment     Status: None   Collection Time: 05/27/17  7:58 AM  Result Value Ref Range Status   Specimen Description SPUTUM  Final   Special Requests NONE  Final   Sputum evaluation THIS SPECIMEN IS ACCEPTABLE FOR SPUTUM CULTURE  Final   Report Status 05/27/2017 FINAL  Final  Culture, respiratory (NON-Expectorated)     Status: None (Preliminary result)   Collection Time: 05/27/17  7:58 AM  Result Value Ref Range Status   Specimen Description SPUTUM  Final   Special Requests NONE Reflexed from [id number]  Final   Gram Stain   Final    FEW WBC PRESENT, PREDOMINANTLY PMN FEW GRAM NEGATIVE RODS Performed at [facility], [address] [place], [place] [address]    Culture PENDING  Incomplete   Report Status PENDING  Incomplete  MRSA PCR  Screening     Status: None   Collection Time: 05/27/17  9:10 AM  Result Value Ref Range Status   MRSA by PCR NEGATIVE NEGATIVE Final    Comment:        The GeneXpert MRSA Assay (FDA approved for NASAL specimens only), is one component of Jennette Leask comprehensive MRSA colonization surveillance program. It is not intended to diagnose MRSA infection nor to guide or monitor treatment for MRSA infections.          Radiology Studies: Dg Chest 2 View  Result Date: 05/27/2017 CLINICAL DATA:  Shortness of breath and cough EXAM: CHEST  2 VIEW COMPARISON:  04/08/2017 FINDINGS: Hyperinflation with with central airways thickening. No consolidation or effusion. Normal heart size. No pneumothorax. IMPRESSION: Hyperinflation with central airways thickening which may be secondary to viral process, reactive airways or bronchitis. Electronically Signed   By: Jasmine PangKim  Fujinaga M.D.   On: 05/27/2017 01:32   Ct Angio Chest Pe W And/or Wo Contrast  Result Date: 05/27/2017 CLINICAL DATA:  Shortness of breath EXAM: CT ANGIOGRAPHY CHEST WITH CONTRAST TECHNIQUE: Multidetector CT imaging of the chest was performed using the standard protocol during bolus administration of intravenous contrast. Multiplanar CT image reconstructions and MIPs were obtained to evaluate the vascular anatomy. CONTRAST:  100 mL Isovue 370 COMPARISON:  Chest radiograph 05/27/2017 FINDINGS: Cardiovascular: Contrast injection is sufficient to demonstrate satisfactory opacification of the pulmonary arteries to the segmental level. There is no pulmonary embolus. The main pulmonary artery is within normal limits for size. There is no CT evidence of acute right heart strain. The visualized aorta is normal. There is Hriday Stai normal 3-vessel arch branching pattern. Heart size is normal, without pericardial effusion. Mediastinum/Nodes: No mediastinal, hilar or axillary lymphadenopathy. The visualized thyroid and thoracic esophageal course are unremarkable.  Lungs/Pleura: There is thickening of the central airways, particularly at the lobar and segmental levels. No focal airspace consolidation. There are multiple nodular and tree-in-bud opacities in the inferior lingula and both lower lobes, right worse than left. No pleural effusion. Upper Abdomen: Contrast bolus timing is not optimized for evaluation of the abdominal organs. Within this limitation, the visualized organs of the upper abdomen are normal. Musculoskeletal: No chest wall abnormality. No acute or significant osseous findings. Review of the MIP images confirms the above findings. IMPRESSION: 1. No pulmonary embolus or acute aortic syndrome. 2. Thickening of the central airway is with nodular and tree-in-bud opacities of both lung bases and the inferior lingula. These findings may be secondary to Phelan Goers viral infectious process or to aspiration. Reactive airway disease could cause the peribronchial thickening, but would not be expected to cause the tree-in-bud opacities. Electronically Signed   By: Chrisandra NettersKevin  Herman M.D.  On: 05/27/2017 05:30        Scheduled Meds: . chlorhexidine  15 mL Mouth Rinse BID  . esomeprazole  20 mg Oral Daily  . guaiFENesin  600 mg Oral BID  . [START ON 05/28/2017] ipratropium  0.5 mg Nebulization TID  . [START ON 05/28/2017] levalbuterol  1.25 mg Nebulization TID  . loratadine  10 mg Oral Daily  . mouth rinse  15 mL Mouth Rinse q12n4p  . methylPREDNISolone (SOLU-MEDROL) injection  60 mg Intravenous Q8H  . mometasone-formoterol  2 puff Inhalation BID  . multivitamin with minerals  1 tablet Oral Daily  . [START ON 05/28/2017] predniSONE  40 mg Oral Q breakfast  . QUEtiapine  50 mg Oral QHS   Continuous Infusions: . sodium chloride 125 mL/hr at 05/27/17 1200  . [START ON 05/28/2017] levofloxacin (LEVAQUIN) IV       LOS: 0 days    Time spent: over 30 min    Lacretia Nicksaldwell Powell, MD [facility] Pager [phone]  If 7PM-7AM, please contact  night-coverage [email or web address] Password [email or web address] 05/27/2017, 8:08 PM

## 2017-05-27 NOTE — ED Notes (Signed)
Attempted to call report to floor. Nurse would like us to wait until after shift change to bring patient upstairs. Charge nurse made aware.

## 2017-05-27 NOTE — ED Notes (Signed)
Respiratory called for breathing tx

## 2017-05-27 NOTE — ED Notes (Signed)
Spoke with our director, Tia AlertStacey Toben, who spoke with charge nurse on the floor about getting patient up before shift change as per our new policy. This RN had a previous conversation with charge nurse on the unit reporting that I could bring patient up before shift change which would be the better option as I am the primary nurse. I reported to her that if the patient was to be transported after shift change, it would be a secondary nurse who would not have as much information on the patient as she was not the primary nurse. Charge nurse said that she thought it would be best to bring patient up after shift change.

## 2017-05-27 NOTE — ED Notes (Signed)
Call report to Samantha at [phone] at [phone]

## 2017-05-27 NOTE — ED Notes (Signed)
MD made aware of BP

## 2017-05-27 NOTE — ED Notes (Signed)
Bed: WA21 Expected date:  Expected time:  Means of arrival:  Comments: EMS from [facility]-wheezing

## 2017-05-27 NOTE — ED Notes (Signed)
Respiratory at bedside for breathing tx

## 2017-05-27 NOTE — Progress Notes (Signed)
CRITICAL VALUE ALERT  Critical Value:  Lactic Acid 3.7  Date & Time Notied:  05/27/17 0901  Provider Notified: Lowell GuitarPowell   Orders Received/Actions taken:  Continue IV fluids and timed Lactic Acid Blood Draws

## 2017-05-27 NOTE — ED Notes (Signed)
Pt placed on 2 liters of O2 via [place]. O2 sat of 86% on RA.

## 2017-05-27 NOTE — Progress Notes (Signed)
Pharmacy Antibiotic Note  George Becker is a 21 y.o. male admitted on 05/27/2017 with pneumonia.  Pharmacy has been consulted for levaquin dosing.  Plan: Levaquin 500 mg (ED) + 250 mg now = 750 mg IV daily F/u scr/cultures/levels  Height: 5\' 11"  (180.3 cm) Weight: 158 lb (71.7 kg) IBW/kg (Calculated) : 75.3  Temp (24hrs), Avg:98.3 F (36.8 C), Min:98.3 F (36.8 C), Max:98.3 F (36.8 C)  Recent Labs  Lab 05/27/17 0353  WBC 12.5*  CREATININE 0.66    Estimated Creatinine Clearance: 149.4 mL/min (by C-G formula based on SCr of 0.66 mg/dL).    Allergies  Allergen Reactions  . Amoxicillin Anaphylaxis and Hives  . Augmentin [Amoxicillin-Pot Clavulanate] Anaphylaxis and Hives  . Other Anaphylaxis    "all Nuts"  . Peanut Butter Flavor     Antimicrobials this admission: 11/27 levaquin >>    >>   Dose adjustments this admission:   Microbiology results:  BCx:   UCx:    Sputum:    MRSA PCR:   Thank you for allowing pharmacy to be a part of this patient's care.  Lorenza EvangelistGreen, Monette Omara R 05/27/2017 6:45 AM

## 2017-05-27 NOTE — ED Triage Notes (Addendum)
[facility] with c/o shortness of breath for 5 days. [facility]. Pt reports he has been spitting up yellow/green sputum. Pt was given a zpack at the facility and steroids as well (prednisone). Pt is also rx'd three nebs a day in addition to his MDI. Pt is still feeling short of breath at this time, tachy for EMS at 130. Pt was given Ibuprofen roughly an 1 hour and 45 minutes ago. Wheezing on left side per EMS. Pt given duoneb by EMS enroute.

## 2017-05-28 DIAGNOSIS — J45901 Unspecified asthma with (acute) exacerbation: Secondary | ICD-10-CM | POA: Diagnosis not present

## 2017-05-28 DIAGNOSIS — B348 Other viral infections of unspecified site: Secondary | ICD-10-CM | POA: Diagnosis not present

## 2017-05-28 DIAGNOSIS — J9601 Acute respiratory failure with hypoxia: Secondary | ICD-10-CM | POA: Diagnosis not present

## 2017-05-28 LAB — BASIC METABOLIC PANEL
ANION GAP: 8 (ref 5–15)
BUN: 9 mg/dL (ref 6–20)
CALCIUM: 9 mg/dL (ref 8.9–10.3)
CO2: 26 mmol/L (ref 22–32)
Chloride: 106 mmol/L (ref 101–111)
Creatinine, Ser: 0.49 mg/dL — ABNORMAL LOW (ref 0.61–1.24)
Glucose, Bld: 158 mg/dL — ABNORMAL HIGH (ref 65–99)
Potassium: 3.8 mmol/L (ref 3.5–5.1)
SODIUM: 140 mmol/L (ref 135–145)

## 2017-05-28 LAB — CBC
HEMATOCRIT: 38.6 % — AB (ref 39.0–52.0)
Hemoglobin: 12.9 g/dL — ABNORMAL LOW (ref 13.0–17.0)
MCH: 29.7 pg (ref 26.0–34.0)
MCHC: 33.4 g/dL (ref 30.0–36.0)
MCV: 88.7 fL (ref 78.0–100.0)
PLATELETS: 255 10*3/uL (ref 150–400)
RBC: 4.35 MIL/uL (ref 4.22–5.81)
RDW: 14 % (ref 11.5–15.5)
WBC: 9.6 10*3/uL (ref 4.0–10.5)

## 2017-05-28 MED ORDER — PREDNISONE 20 MG PO TABS: 40.0000 mg | ORAL_TABLET | Freq: Every day | ORAL | 0 refills | Status: AC

## 2017-05-28 MED ORDER — LEVOFLOXACIN 750 MG PO TABS: 750.0000 mg | ORAL_TABLET | Freq: Every day | ORAL | 0 refills | Status: AC

## 2017-05-28 NOTE — Discharge Summary (Signed)
Physician Discharge Summary  [name] ZOX:[id number] DOB: 24-Dec-1995 DOA: 05/27/2017  PCP: Ronnette Juniper, MD  Admit date: 05/27/2017 Discharge date: 05/28/2017  Time spent: over 30 minutes  Recommendations for Outpatient Follow-up:  1. Follow up outpatient CBC/CMP 2. Continue to monitor respiratory status 3. Follow up blood culture and final sputum cx as well as urine legionella 4. Follow up hepatitis panel drawn here.  HIV negative here.  Will need follow up surveillance by outpatient providers given recent IVDU.   Discharge Diagnoses:  Principal Problem:   Acute respiratory failure with hypoxia (HCC) Active Problems:   Asthma exacerbation   Polysubstance abuse (HCC)   Continuous dependence on cigarette smoking   Sepsis (HCC)   Lobar pneumonia (HCC)   Depression with anxiety   Discharge Condition: stable  Diet recommendation: heart healthy  Filed Weights   05/27/17 0018  Weight: 71.7 kg (158 lb)    History of present illness:  George Becker 21 y.o.malewith medical history significant ofpolysubstance abuse (tobacco,heroin andMethylphenidate),asthma, GERD, depression, anxiety, who presents with cough and shortness of breath.  [facility] He is taking prnTylenol and ibuprofen,andprnmuscle relaxer. In thepast 5 days, he has been having cough andSOB, which has been progressively getting worse. He coughs up yellow colored sputum.He speaks in full sentence.He has chills and low-grade fever. Denies chest pain. Patientis treated withprednisone in the past4days, and started with Z-Pak yesterday, but no significant improvement. He does not have nausea, vomiting, diarrhea, abdominal pain, symptoms of UTI or unilateral weakness.  He improved with antibiotics, steroids, and scheduled nebs.  He was found to have rhinovirus.  I recommended discontinuation of antibiotics given positive  rhinovirus, but patient and his mom strongly preferred to finish course.  Will send with prednisone for additional 3 days for 5 day total course and levaquin as well.   Hospital Course:  Acute respiratory failure with hypoxiaand sepsis: Positive rhinovirus.  Sputum culture pending.  CT angio without evidence of PE.  Notable for central airway thickening with nodular and tree in bud opacities of both lung bases and the inferior lingula "these findings may be secondary to viral infectious process or aspiration" ("reactive airway disease could cause the peribronchia thicening, but would not be expected to cause the tree in bud opacities"). - Improved with abx, steroids, and nebs - Discussed risks of fluoroquinolones with patient and at discharge given positive rhinovirus discussed recommendation to discontinue as this is likely cause of his asthma exacerbation, but patient and his mom preferred to continue the levaquin to completion. -Urinelegionella pending andS. pneumococcal antigen negative -Nasal cannula oxygen as neededto maintain O2 saturation 92% or greater -elevated lactate improved after IVF -IVF: 2.5L of NS bolus in ED, followed by 125 cc/h  Asthma exacerbationand possibleLobar pneumonia: -see above  Polysubstance abuse (HCC): includingtobacco,heroin andMethylphenidate. [facility]. -counseled by previous provider -Nicotine patch -Check UDS(negative) -HIV negative, Hepatitis panel ordered prior to discharge and pending -prnibuprofen or Tylenol for pain -When necessary Robaxin for back painandmuscle spasm  Depression with anxiety: -continueSeroquel  GERD: -Nexium    Procedures:  none (i.e. Studies not automatically included, echos, thoracentesis, etc; not x-rays)  Consultations:  none  Discharge Exam: Vitals:   05/28/17 0744 05/28/17 1130  BP:    Pulse:    Resp:    Temp:    SpO2: 97% 93%   Feeling good.  No SOB.     General: No  acute distress. Cardiovascular: Heart sounds show Morio Widen regular rate, and rhythm. No gallops or rubs. No murmurs. No JVD. Lungs: Clear to auscultation bilaterally with good air movement. No rales, rhonchi or wheezes. Abdomen: Soft, nontender, nondistended with normal active bowel sounds. No masses. No hepatosplenomegaly. Neurological: Alert and oriented 3. Moves all extremities 4 with equal strength. Cranial nerves II through XII grossly intact. Skin: Warm and dry. No rashes or lesions. Extremities: No clubbing or cyanosis. No edema. Pedal pulses 2+. Psychiatric: Mood and affect are normal. Insight and judgment are appropriate.   Discharge Instructions   Discharge Instructions    Call MD for:  difficulty breathing, headache or visual disturbances   Complete by:  As directed    Call MD for:  extreme fatigue   Complete by:  As directed    Call MD for:  persistant dizziness or light-headedness   Complete by:  As directed    Call MD for:  persistant nausea and vomiting   Complete by:  As directed    Call MD for:  temperature >100.4   Complete by:  As directed    Diet - low sodium heart healthy   Complete by:  As directed    Discharge instructions   Complete by:  As directed    You are medically [facility].  You had rhinovirus infection which caused an asthma exacerbation.  We will continue your steroids and levaquin for another 3 days.  Your outpatient provider should follow up the final cultures that are pending.  Continue your albuterol inhalers every 4-6 hours until your symptoms improve.  You HIV testing was negative here and you noted negative testing at [facility] as well, you will need continued monitoring with your recent IV drug use.  We sent hepatitis studies that are pending on your discharge and should be followed up with your primary provider as well.  Please follow up with the providers at [facility] regarding your repeat testing  (for HIV, hepatitis B, and hepatitis C) or your primary care doctor if you establish with them.  After you establish with Dusan Lipford new primary care provider, please have them request records from [facility] so they know what was done and what needs to be followed up.   Increase activity slowly   Complete by:  As directed      Current Discharge Medication List    START taking these medications   Details  levofloxacin (LEVAQUIN) 750 MG tablet Take 1 tablet (750 mg total) by mouth daily for 3 days. Qty: 3 tablet, Refills: 0    predniSONE (DELTASONE) 20 MG tablet Take 2 tablets (40 mg total) by mouth daily with breakfast for 3 days. Qty: 6 tablet, Refills: 0      CONTINUE these medications which have NOT CHANGED   Details  albuterol (PROVENTIL HFA;VENTOLIN HFA) 108 (90 Base) MCG/ACT inhaler Inhale 2 puffs into the lungs every 6 (six) hours as needed for wheezing or shortness of breath. Qty: 1 Inhaler, Refills: 2    albuterol (PROVENTIL) (2.5 MG/3ML) 0.083% nebulizer solution Take 2.5 mg by nebulization every 8 (eight) hours as needed for wheezing or shortness of breath.    alum & mag hydroxide-simeth (MAALOX/MYLANTA) 200-200-20 MG/5ML suspension Take 30-60 mLs by mouth as needed for indigestion or heartburn.    benzocaine-menthol (CHLORAEPTIC) 6-10 MG lozenge Take 1 lozenge by mouth as needed for sore throat.    benzonatate (TESSALON) 100 MG capsule Take 200 mg by mouth 3 (  three) times daily as needed for cough.    busPIRone (BUSPAR) 5 MG tablet Take 5 mg by mouth 2 (two) times daily.    cetirizine (ZYRTEC) 10 MG tablet Take 10 mg by mouth daily as needed for allergies.    EPINEPHrine 0.3 mg/0.3 mL IJ SOAJ injection Inject 0.3 mg into the muscle once.    esomeprazole (NEXIUM) 20 MG packet Take 20 mg by mouth daily before breakfast.    Fluticasone-Salmeterol (ADVAIR DISKUS) 250-50 MCG/DOSE AEPB Inhale 1 puff into the lungs 2 (two) times daily. Qty: 720 each, Refills: 0     Fructose-Dextrose-Phosphor Acd (NAUSEA CONTROL) 1.87-1.87-21.5 SOLN Take 15-30 mLs by mouth every 15 (fifteen) minutes as needed (nausea).    guaiFENesin (MUCINEX) 600 MG 12 hr tablet Take 600 mg by mouth 2 (two) times daily.    ibuprofen (ADVIL,MOTRIN) 200 MG tablet Take 600 mg by mouth every 6 (six) hours as needed for moderate pain.     loperamide (IMODIUM Stillman Buenger-D) 2 MG tablet Take 4 mg by mouth 4 (four) times daily as needed for diarrhea or loose stools.    Multiple Vitamin (MULTIVITAMIN WITH MINERALS) TABS tablet Take 1 tablet by mouth daily.    nicotine (NICODERM CQ - DOSED IN MG/24 HOURS) 21 mg/24hr patch Place 21 mg onto the skin daily as needed (cravings).    nicotine polacrilex (NICORETTE) 2 MG gum Take 2 mg by mouth as needed for smoking cessation.    ondansetron (ZOFRAN) 8 MG tablet Take 8 mg by mouth 2 (two) times daily as needed for nausea or vomiting.    QUEtiapine (SEROQUEL) 50 MG tablet Take 50 mg by mouth at bedtime.    diltiazem (CARDIZEM) 60 MG tablet Take 1 tablet (60 mg total) by mouth 4 (four) times daily as needed. Qty: 120 tablet, Refills: 0      STOP taking these medications     azithromycin (ZITHROMAX) 250 MG tablet        Allergies  Allergen Reactions  . Amoxicillin Anaphylaxis and Hives  . Augmentin [Amoxicillin-Pot Clavulanate] Anaphylaxis and Hives  . Other Anaphylaxis    "all Nuts"  . Peanut Butter Flavor    Follow-up Information    Ronnette JuniperPringle, Joseph, MD.   Specialty:  Pediatrics Contact information: [address] [facility] [place] [place] [address] [phone]            The results of significant diagnostics from this hospitalization (including imaging, microbiology, ancillary and laboratory) are listed below for reference.    Significant Diagnostic Studies: Dg Chest 2 View  Result Date: 05/27/2017 CLINICAL DATA:  Shortness of breath and cough EXAM: CHEST  2 VIEW COMPARISON:  04/08/2017 FINDINGS:  Hyperinflation with with central airways thickening. No consolidation or effusion. Normal heart size. No pneumothorax. IMPRESSION: Hyperinflation with central airways thickening which may be secondary to viral process, reactive airways or bronchitis. Electronically Signed   By: Jasmine PangKim  Fujinaga M.D.   On: 05/27/2017 01:32   Ct Angio Chest Pe W And/or Wo Contrast  Result Date: 05/27/2017 CLINICAL DATA:  Shortness of breath EXAM: CT ANGIOGRAPHY CHEST WITH CONTRAST TECHNIQUE: Multidetector CT imaging of the chest was performed using the standard protocol during bolus administration of intravenous contrast. Multiplanar CT image reconstructions and MIPs were obtained to evaluate the vascular anatomy. CONTRAST:  100 mL Isovue 370 COMPARISON:  Chest radiograph 05/27/2017 FINDINGS: Cardiovascular: Contrast injection is sufficient to demonstrate satisfactory opacification of the pulmonary arteries to the segmental level. There is no pulmonary  embolus. The main pulmonary artery is within normal limits for size. There is no CT evidence of acute right heart strain. The visualized aorta is normal. There is Emily Forse normal 3-vessel arch branching pattern. Heart size is normal, without pericardial effusion. Mediastinum/Nodes: No mediastinal, hilar or axillary lymphadenopathy. The visualized thyroid and thoracic esophageal course are unremarkable. Lungs/Pleura: There is thickening of the central airways, particularly at the lobar and segmental levels. No focal airspace consolidation. There are multiple nodular and tree-in-bud opacities in the inferior lingula and both lower lobes, right worse than left. No pleural effusion. Upper Abdomen: Contrast bolus timing is not optimized for evaluation of the abdominal organs. Within this limitation, the visualized organs of the upper abdomen are normal. Musculoskeletal: No chest wall abnormality. No acute or significant osseous findings. Review of the MIP images confirms the above findings.  IMPRESSION: 1. No pulmonary embolus or acute aortic syndrome. 2. Thickening of the central airway is with nodular and tree-in-bud opacities of both lung bases and the inferior lingula. These findings may be secondary to Ashiah Karpowicz viral infectious process or to aspiration. Reactive airway disease could cause the peribronchial thickening, but would not be expected to cause the tree-in-bud opacities. Electronically Signed   By: Deatra Robinson M.D.   On: 05/27/2017 05:30    Microbiology: Recent Results (from the past 240 hour(s))  Respiratory Panel by PCR     Status: Abnormal   Collection Time: 05/27/17  6:24 AM  Result Value Ref Range Status   Adenovirus NOT DETECTED NOT DETECTED Final   Coronavirus 229E NOT DETECTED NOT DETECTED Final   Coronavirus HKU1 NOT DETECTED NOT DETECTED Final   Coronavirus NL63 NOT DETECTED NOT DETECTED Final   Coronavirus OC43 NOT DETECTED NOT DETECTED Final   Metapneumovirus NOT DETECTED NOT DETECTED Final   Rhinovirus / Enterovirus DETECTED (Shay Bartoli) NOT DETECTED Final   Influenza Wynell Halberg NOT DETECTED NOT DETECTED Final   Influenza B NOT DETECTED NOT DETECTED Final   Parainfluenza Virus 1 NOT DETECTED NOT DETECTED Final   Parainfluenza Virus 2 NOT DETECTED NOT DETECTED Final   Parainfluenza Virus 3 NOT DETECTED NOT DETECTED Final   Parainfluenza Virus 4 NOT DETECTED NOT DETECTED Final   Respiratory Syncytial Virus NOT DETECTED NOT DETECTED Final   Bordetella pertussis NOT DETECTED NOT DETECTED Final   Chlamydophila pneumoniae NOT DETECTED NOT DETECTED Final   Mycoplasma pneumoniae NOT DETECTED NOT DETECTED Final    Comment: Performed at [facility], [address], [place], [place] [address]  Culture, blood (x 2)     Status: None (Preliminary result)   Collection Time: 05/27/17  6:40 AM  Result Value Ref Range Status   Specimen Description BLOOD RIGHT ANTECUBITAL  Final   Special Requests   Final    BOTTLES DRAWN AEROBIC AND ANAEROBIC Blood Culture adequate volume    Culture   Final    NO GROWTH 1 DAY Performed at [facility], [address], [place], [place] [address]    Report Status PENDING  Incomplete  Culture, blood (x 2)     Status: None (Preliminary result)   Collection Time: 05/27/17  7:28 AM  Result Value Ref Range Status   Specimen Description BLOOD RIGHT ANTECUBITAL  Final   Special Requests   Final    BOTTLES DRAWN AEROBIC AND ANAEROBIC Blood Culture adequate volume   Culture   Final    NO GROWTH 1 DAY Performed at [facility], [address], [place], [place] [address]  Report Status PENDING  Incomplete  Culture, sputum-assessment     Status: None   Collection Time: 05/27/17  7:58 AM  Result Value Ref Range Status   Specimen Description SPUTUM  Final   Special Requests NONE  Final   Sputum evaluation THIS SPECIMEN IS ACCEPTABLE FOR SPUTUM CULTURE  Final   Report Status 05/27/2017 FINAL  Final  Culture, respiratory (NON-Expectorated)     Status: None (Preliminary result)   Collection Time: 05/27/17  7:58 AM  Result Value Ref Range Status   Specimen Description SPUTUM  Final   Special Requests NONE Reflexed from [id number]  Final   Gram Stain   Final    FEW WBC PRESENT, PREDOMINANTLY PMN FEW GRAM NEGATIVE RODS    Culture   Final    CULTURE REINCUBATED FOR BETTER GROWTH Performed at [facility], [address] [place], [place] [address]    Report Status PENDING  Incomplete  MRSA PCR Screening     Status: None   Collection Time: 05/27/17  9:10 AM  Result Value Ref Range Status   MRSA by PCR NEGATIVE NEGATIVE Final    Comment:        The GeneXpert MRSA Assay (FDA approved for NASAL specimens only), is one component of Keymiah Lyles comprehensive MRSA colonization surveillance program. It is not intended to diagnose MRSA infection nor to guide or monitor treatment for MRSA infections.      Labs: Basic Metabolic Panel: Recent Labs  Lab 05/27/17 0353 05/28/17 0358  NA 138 140  K 3.6 3.8  CL 103 106  CO2  27 26  GLUCOSE 119* 158*  BUN 11 9  CREATININE 0.66 0.49*  CALCIUM 8.4* 9.0   Liver Function Tests: No results for input(s): AST, ALT, ALKPHOS, BILITOT, PROT, ALBUMIN in the last 168 hours. No results for input(s): LIPASE, AMYLASE in the last 168 hours. No results for input(s): AMMONIA in the last 168 hours. CBC: Recent Labs  Lab 05/27/17 0353 05/28/17 0358  WBC 12.5* 9.6  HGB 12.7* 12.9*  HCT 37.2* 38.6*  MCV 88.4 88.7  PLT 199 255   Cardiac Enzymes: No results for input(s): CKTOTAL, CKMB, CKMBINDEX, TROPONINI in the last 168 hours. BNP: BNP (last 3 results) No results for input(s): BNP in the last 8760 hours.  ProBNP (last 3 results) No results for input(s): PROBNP in the last 8760 hours.  CBG: No results for input(s): GLUCAP in the last 168 hours.     Signed:  Lacretia Nicksaldwell Powell MD.  [facility] 05/28/2017, 2:54 PM

## 2017-05-28 NOTE — Progress Notes (Signed)
Report called to Nurse manager at [facility]. Manager sending transporter to pick the patient up.

## 2017-05-28 NOTE — Progress Notes (Addendum)
[facility] and shortness of breath. Plan: patient to return and continue SA treatment. Patient signed consent form for CSW to provide clinical information to [facility].  CSW faxed information to [phone]   Information under review for readmission.   D/C Summary faxed.   Dois DavenportSandra RN, will readmit the patient.  The patient will be transported by facility. CSW informed nurse.  No other needs identified.     Vivi BarrackNicole Theoren Palka, Theresia MajorsLCSWA, MSW Clinical Social Worker  [phone] 05/28/2017  1:47 PM

## 2017-05-29 LAB — CULTURE, RESPIRATORY: CULTURE: NORMAL

## 2017-05-29 LAB — CULTURE, RESPIRATORY W GRAM STAIN

## 2017-05-29 LAB — HEPATITIS PANEL, ACUTE
Hep A IgM: NEGATIVE
Hep B C IgM: NEGATIVE
Hepatitis B Surface Ag: NEGATIVE

## 2017-05-30 LAB — LEGIONELLA PNEUMOPHILA SEROGP 1 UR AG: L. PNEUMOPHILA SEROGP 1 UR AG: NEGATIVE

## 2017-06-01 LAB — CULTURE, BLOOD (ROUTINE X 2)
Culture: NO GROWTH
Culture: NO GROWTH
Special Requests: ADEQUATE
Special Requests: ADEQUATE

## 2017-06-16 ENCOUNTER — Ambulatory Visit (HOSPITAL_BASED_OUTPATIENT_CLINIC_OR_DEPARTMENT_OTHER): Payer: 59 | Attending: Cardiology

## 2017-09-06 ENCOUNTER — Emergency Department (HOSPITAL_COMMUNITY): Payer: 59

## 2017-09-06 ENCOUNTER — Emergency Department (HOSPITAL_COMMUNITY)
Admission: EM | Admit: 2017-09-06 | Discharge: 2017-09-06 | Disposition: A | Discharge status: Home / Self Care | Payer: 59 | Attending: Emergency Medicine | Admitting: Emergency Medicine

## 2017-09-06 ENCOUNTER — Encounter (HOSPITAL_COMMUNITY): Payer: Self-pay | Admitting: Emergency Medicine

## 2017-09-06 DIAGNOSIS — I959 Hypotension, unspecified: Secondary | ICD-10-CM | POA: Diagnosis not present

## 2017-09-06 DIAGNOSIS — F1910 Other psychoactive substance abuse, uncomplicated: Secondary | ICD-10-CM

## 2017-09-06 DIAGNOSIS — F10929 Alcohol use, unspecified with intoxication, unspecified: Secondary | ICD-10-CM | POA: Diagnosis present

## 2017-09-06 DIAGNOSIS — J45909 Unspecified asthma, uncomplicated: Secondary | ICD-10-CM | POA: Diagnosis not present

## 2017-09-06 DIAGNOSIS — F17210 Nicotine dependence, cigarettes, uncomplicated: Secondary | ICD-10-CM | POA: Diagnosis not present

## 2017-09-06 DIAGNOSIS — F1990 Other psychoactive substance use, unspecified, uncomplicated: Secondary | ICD-10-CM | POA: Diagnosis not present

## 2017-09-06 LAB — CBC WITH DIFFERENTIAL/PLATELET
Basophils Absolute: 0.1 10*3/uL (ref 0.0–0.1)
Basophils Relative: 0 %
Eosinophils Absolute: 1.1 10*3/uL — ABNORMAL HIGH (ref 0.0–0.7)
Eosinophils Relative: 8 %
HCT: 48.3 % (ref 39.0–52.0)
Hemoglobin: 17.2 g/dL — ABNORMAL HIGH (ref 13.0–17.0)
LYMPHS ABS: 5.2 10*3/uL — AB (ref 0.7–4.0)
LYMPHS PCT: 37 %
MCH: 30.7 pg (ref 26.0–34.0)
MCHC: 35.6 g/dL (ref 30.0–36.0)
MCV: 86.3 fL (ref 78.0–100.0)
Monocytes Absolute: 0.8 10*3/uL (ref 0.1–1.0)
Monocytes Relative: 6 %
NEUTROS ABS: 6.8 10*3/uL (ref 1.7–7.7)
NEUTROS PCT: 49 %
Platelets: 329 10*3/uL (ref 150–400)
RBC: 5.6 MIL/uL (ref 4.22–5.81)
RDW: 13.3 % (ref 11.5–15.5)
WBC: 13.9 10*3/uL — AB (ref 4.0–10.5)

## 2017-09-06 LAB — SALICYLATE LEVEL: Salicylate Lvl: <7 mg/dL (ref 2.8–30.0)

## 2017-09-06 LAB — COMPREHENSIVE METABOLIC PANEL
ALBUMIN: 4.5 g/dL (ref 3.5–5.0)
ALT: 22 U/L (ref 17–63)
AST: 18 U/L (ref 15–41)
Alkaline Phosphatase: 46 U/L (ref 38–126)
Anion gap: 18 — ABNORMAL HIGH (ref 5–15)
BILIRUBIN TOTAL: 0.6 mg/dL (ref 0.3–1.2)
BUN: 5 mg/dL — AB (ref 6–20)
CHLORIDE: 104 mmol/L (ref 101–111)
CO2: 21 mmol/L — AB (ref 22–32)
Calcium: 9.4 mg/dL (ref 8.9–10.3)
Creatinine, Ser: 0.65 mg/dL (ref 0.61–1.24)
GFR calc Af Amer: >60 mL/min (ref >60)
GFR calc non Af Amer: >60 mL/min (ref >60)
Glucose, Bld: 125 mg/dL — ABNORMAL HIGH (ref 65–99)
POTASSIUM: 3.5 mmol/L (ref 3.5–5.1)
Sodium: 143 mmol/L (ref 135–145)
Total Protein: 7.5 g/dL (ref 6.5–8.1)

## 2017-09-06 LAB — ETHANOL: Alcohol, Ethyl (B): <10 mg/dL (ref <10)

## 2017-09-06 LAB — ACETAMINOPHEN LEVEL: Acetaminophen (Tylenol), Serum: <10 ug/mL — ABNORMAL LOW (ref 10–30)

## 2017-09-06 MED ORDER — LORAZEPAM 2 MG/ML IJ SOLN
2.0000 mg | Freq: Once | INTRAMUSCULAR | Status: AC
  Administered 2017-09-06: 2 mg via INTRAVENOUS

## 2017-09-06 MED ORDER — SODIUM CHLORIDE 0.9 % IV BOLUS (SEPSIS)
1000.0000 mL | Freq: Once | INTRAVENOUS | Status: AC
  Administered 2017-09-06: 1000 mL via INTRAVENOUS

## 2017-09-06 MED ORDER — LORAZEPAM 2 MG/ML IJ SOLN
INTRAMUSCULAR | Status: AC
  Administered 2017-09-06: 04:00:00 via INTRAVENOUS
  Filled 2017-09-06: qty 1

## 2017-09-06 MED ORDER — IPRATROPIUM-ALBUTEROL 0.5-2.5 (3) MG/3ML IN SOLN
3.0000 mL | Freq: Once | RESPIRATORY_TRACT | Status: AC
  Administered 2017-09-06: 3 mL via RESPIRATORY_TRACT
  Filled 2017-09-06: qty 3
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
## 2017-09-06 NOTE — ED Notes (Signed)
Pt up awake able to walk on his own power able to keep down a drink

## 2017-09-06 NOTE — ED Provider Notes (Signed)
Assumed care at 0700.  Patient is a 22 year old male with a history of polysubstance abuse who had been clean for 100 days and relapsed 1 week ago.  He has been using heroin, methamphetamines and upon arrival was altered, agitated receiving Ativan.  Currently patient will wake up but he is still very somnolent from medications.  Low suspicion for intracranial injury.  Labs are relatively benign.  Patient remains tachycardic but is sleeping comfortably.  We will continue IV fluids and give time to metabolize.  Mom updated with the plan.  9:54 AM Patient ambulated in the department without difficulty.  Patient's mom is present.  He is back to baseline and feel that he is safe for discharge home.  He was given outpatient resources.   Gwyneth SproutPlunkett, Devonne Kitchen, MD 09/06/17 [phone]
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
## 2017-09-06 NOTE — ED Notes (Signed)
Dr. Jacqulyn BathLong ( EDP) explained tests results to pt.'s mother at bedside and plan of care , pt. sleeping /respirations unlabored , O2 sat= 100% 2 lpm/[place] , IV site / condo cath intact . No urine output at this time .

## 2017-09-12 ENCOUNTER — Ambulatory Visit (INDEPENDENT_AMBULATORY_CARE_PROVIDER_SITE_OTHER): Payer: 59 | Admitting: Pulmonary Disease

## 2017-09-12 ENCOUNTER — Other Ambulatory Visit: Payer: Self-pay | Admitting: Pulmonary Disease

## 2017-09-12 ENCOUNTER — Encounter: Payer: Self-pay | Admitting: Pulmonary Disease

## 2017-09-12 ENCOUNTER — Other Ambulatory Visit: Payer: 59

## 2017-09-12 VITALS — BP 134/74 | HR 93 | Ht 71.0 in | Wt 167.0 lb

## 2017-09-12 DIAGNOSIS — J45909 Unspecified asthma, uncomplicated: Secondary | ICD-10-CM | POA: Diagnosis not present

## 2017-09-12 LAB — NITRIC OXIDE: NITRIC OXIDE: 115

## 2017-09-12 LAB — PULMONARY FUNCTION TEST
FEF 25-75 Pre: 2.02 L/sec
FEF2575-%Pred-Pre: 40 %
FEV1-%PRED-PRE: 75 %
FEV1-Pre: 3.62 L
FEV1FVC-%PRED-PRE: 71 %
FEV6-%PRED-PRE: 101 %
FEV6-Pre: 5.85 L
FEV6FVC-%Pred-Pre: 97 %
FVC-%Pred-Pre: 103 %
FVC-Pre: 5.99 L
PRE FEV1/FVC RATIO: 60 %
Pre FEV6/FVC Ratio: 98 %

## 2017-09-12 MED ORDER — ALBUTEROL SULFATE (2.5 MG/3ML) 0.083% IN NEBU: 2.5000 mg | INHALATION_SOLUTION | Freq: Three times a day (TID) | RESPIRATORY_TRACT | 5 refills | Status: DC | PRN

## 2017-09-12 MED ORDER — NICOTINE 10 MG IN INHA: 1.0000 | RESPIRATORY_TRACT | 2 refills | Status: DC | PRN

## 2017-09-12 NOTE — Patient Instructions (Signed)
Severe persistent asthma: Use your Advair twice a day Use albuterol as needed for chest tightness wheezing and shortness of breath Avoid tobacco use Check serum IgE today Spirometry today Exhaled nitric oxide testing today  Cigarette smoker: We will prescribe Nicotrol inhaler to help you with smoking cessation  Allergic rhinitis: You need to try to suppress your cough to allow your larynx (voice box) to heal.  For three days don't talk, laugh, sing, or clear your throat. Do everything you can to suppress the cough during this time. Use hard candies (sugarless Jolly Ranchers) or non-mint or non-menthol containing cough drops during this time to soothe your throat.  Use a cough suppressant (Delsym or what I have prescribed you) around the clock during this time.  After three days, gradually increase the use of your voice and back off on the cough suppressants.  Methamphetamine use: I am glad you trying her best to get some help, continue to participate in rehab

## 2017-09-12 NOTE — Progress Notes (Signed)
Subjective:   PATIENT ID: Karoline Caldwell GENDER: male DOB: [birth year], MRN: [id number]  Synopsis: Former patient of Dr. Belia Heman with asthma HPI  Chief Complaint  Patient presents with  . Consult    switching from BT office d/t move.     This is a pleasant 22 year old male who comes to my clinic today to establish care for moderate persistent asthma.  He has a history of asthma that was diagnosed at age 31 when he had some shortness of breath and had to go to the emergency room.  Throughout his childhood he had use albuterol as needed to manage symptoms and briefly when he was in middle school he needed Pulmicort.  He was never hospitalized during this time but he would struggle with exacerbations from time to time.  However, unfortunately he started smoking and developed a significant drug problem as a teenager and this has greatly worsened control of his asthma.  When using drugs (meth) he typically does not use his Advair and he has more trouble breathing, chest tightness, wheezing and cough.  He has developed pneumonia and has been hospitalized in the past for this.  He says that he also has significant postnasal drip and sinus symptoms.  Over the years he has had eczema but he says that this is been well controlled with a cortical steroid drip recently.  He is also been on immunotherapy from time to time in the past.  He says that when he is able to use the Advair regularly he has minimal problems breathing.  He denies gastroesophageal reflux disease symptoms.  Past Medical History:  Diagnosis Date  . Anxiety   . Asthma      Family History  Problem Relation Age of Onset  . Diabetes Mother   . Hypertension Father   . Asthma Maternal Grandfather      Social History   Socioeconomic History  . Marital status: Single    Spouse name: Not on file  . Number of children: Not on file  . Years of education: Not on file  . Highest education level: Not on file  Social Needs  .  Financial resource strain: Not on file  . Food insecurity - worry: Not on file  . Food insecurity - inability: Not on file  . Transportation needs - medical: Not on file  . Transportation needs - non-medical: Not on file  Occupational History  . Not on file  Tobacco Use  . Smoking status: Current Every Day Smoker    Packs/day: 1.00    Years: 4.00    Pack years: 4.00    Types: Cigarettes  . Smokeless tobacco: Never Used  Substance and Sexual Activity  . Alcohol use: Yes    Alcohol/week: 0.0 oz  . Drug use: Yes    Types: Heroin, Methylphenidate    Comment: Treatment for 10 days   . Sexual activity: Not on file  Other Topics Concern  . Not on file  Social History Narrative  . Not on file     Allergies  Allergen Reactions  . Amoxicillin Anaphylaxis and Hives  . Augmentin [Amoxicillin-Pot Clavulanate] Anaphylaxis and Hives  . Other Anaphylaxis    "all Nuts"  . Peanut Butter Flavor      Outpatient Medications Prior to Visit  Medication Sig Dispense Refill  . albuterol (PROVENTIL HFA;VENTOLIN HFA) 108 (90 Base) MCG/ACT inhaler Inhale 2 puffs into the lungs every 6 (six) hours as needed for wheezing or shortness of breath. 1  Inhaler 2  . busPIRone (BUSPAR) 5 MG tablet Take 5 mg by mouth 2 (two) times daily.    Marland Kitchen EPINEPHrine 0.3 mg/0.3 mL IJ SOAJ injection Inject 0.3 mg into the muscle once.    . Fluticasone-Salmeterol (ADVAIR) 500-50 MCG/DOSE AEPB Inhale 1 puff into the lungs 2 (two) times daily.    Marland Kitchen ibuprofen (ADVIL,MOTRIN) 200 MG tablet Take 600 mg by mouth every 6 (six) hours as needed for moderate pain.     . montelukast (SINGULAIR) 10 MG tablet Take 10 mg by mouth daily as needed.    Marland Kitchen QUEtiapine (SEROQUEL) 50 MG tablet Take 50 mg by mouth at bedtime.    Marland Kitchen albuterol (PROVENTIL) (2.5 MG/3ML) 0.083% nebulizer solution Take 2.5 mg by nebulization every 8 (eight) hours as needed for wheezing or shortness of breath.    . esomeprazole (NEXIUM) 20 MG packet Take 20 mg by mouth  daily as needed.     . nicotine (NICODERM CQ - DOSED IN MG/24 HOURS) 21 mg/24hr patch Place 21 mg onto the skin daily as needed (cravings).    . nicotine polacrilex (NICORETTE) 2 MG gum Take 2 mg by mouth as needed for smoking cessation.    Marland Kitchen alum & mag hydroxide-simeth (MAALOX/MYLANTA) 200-200-20 MG/5ML suspension Take 30-60 mLs by mouth as needed for indigestion or heartburn.    . benzocaine-menthol (CHLORAEPTIC) 6-10 MG lozenge Take 1 lozenge by mouth as needed for sore throat.    . benzonatate (TESSALON) 100 MG capsule Take 200 mg by mouth 3 (three) times daily as needed for cough.    . cetirizine (ZYRTEC) 10 MG tablet Take 10 mg by mouth daily as needed for allergies.    Marland Kitchen diltiazem (CARDIZEM) 60 MG tablet Take 1 tablet (60 mg total) by mouth 4 (four) times daily as needed. (Patient not taking: Reported on 09/12/2017) 120 tablet 0  . Fluticasone-Salmeterol (ADVAIR DISKUS) 250-50 MCG/DOSE AEPB Inhale 1 puff into the lungs 2 (two) times daily. 720 each 0  . Fructose-Dextrose-Phosphor Acd (NAUSEA CONTROL) 1.87-1.87-21.5 SOLN Take 15-30 mLs by mouth every 15 (fifteen) minutes as needed (nausea).    Marland Kitchen guaiFENesin (MUCINEX) 600 MG 12 hr tablet Take 600 mg by mouth 2 (two) times daily.    Marland Kitchen loperamide (IMODIUM A-D) 2 MG tablet Take 4 mg by mouth 4 (four) times daily as needed for diarrhea or loose stools.    . Multiple Vitamin (MULTIVITAMIN WITH MINERALS) TABS tablet Take 1 tablet by mouth daily.    . ondansetron (ZOFRAN) 8 MG tablet Take 8 mg by mouth 2 (two) times daily as needed for nausea or vomiting.     No facility-administered medications prior to visit.     Review of Systems  Constitutional: Negative for chills, fever, malaise/fatigue and weight loss.  HENT: Positive for congestion. Negative for nosebleeds, sinus pain and sore throat.   Eyes: Negative for photophobia, pain and discharge.  Respiratory: Positive for cough, sputum production, shortness of breath and wheezing. Negative for  hemoptysis.   Cardiovascular: Negative for chest pain, palpitations, orthopnea and leg swelling.  Gastrointestinal: Negative for abdominal pain, constipation, diarrhea, nausea and vomiting.  Genitourinary: Negative for dysuria, frequency, hematuria and urgency.  Musculoskeletal: Negative for back pain, joint pain, myalgias and neck pain.  Skin: Negative for itching and rash.  Neurological: Negative for tingling, tremors, sensory change, speech change, focal weakness, seizures, weakness and headaches.  Psychiatric/Behavioral: Negative for memory loss, substance abuse and suicidal ideas. The patient is not nervous/anxious.  Objective:  Physical Exam   Vitals:   09/12/17 0911  BP: 134/74  Pulse: 93  SpO2: 100%  Weight: 167 lb (75.8 kg)  Height: 5\' 11"  (1.803 m)   RA  Gen: well appearing, no acute distress HENT: NCAT, OP clear, neck supple without masses Eyes: PERRL, EOMi Lymph: no cervical lymphadenopathy PULM: CTA B CV: RRR, no mgr, no JVD GI: BS+, soft, nontender, no hsm Derm: no rash or skin breakdown MSK: normal bulk and tone Neuro: A&Ox4, CN II-XII intact, strength 5/5 in all 4 extremities Psyche: normal mood and affect   CBC    Component Value Date/Time   WBC 13.9 (H) 09/06/2017 0410   RBC 5.60 09/06/2017 0410   HGB 17.2 (H) 09/06/2017 0410   HCT 48.3 09/06/2017 0410   PLT 329 09/06/2017 0410   MCV 86.3 09/06/2017 0410   MCH 30.7 09/06/2017 0410   MCHC 35.6 09/06/2017 0410   RDW 13.3 09/06/2017 0410   LYMPHSABS 5.2 (H) 09/06/2017 0410   MONOABS 0.8 09/06/2017 0410   EOSABS 1.1 (H) 09/06/2017 0410   BASOSABS 0.1 09/06/2017 0410     Chest imaging: March 2019 chest x-ray images independently reviewed showing normal pulmonary parenchyma, normal cardiac silhouette  PFT:  Labs:  Path:  Echo:  Heart Catheterization:       Assessment & Plan:   Uncomplicated asthma, unspecified asthma severity, unspecified whether persistent - Plan: IgE,  Nitric oxide, Pulmonary function test, CANCELED: Spirometry with Graph  Discussion: This is a pleasant 23 year old male who struggles with tobacco use as well as use of methamphetamines who comes to our clinic with poorly controlled asthma.  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  The resolution to his problems is fairly simple though it is quitting smoking and using his Advair regularly.  When he is able to do this he says he feels fine.  However, he struggles with medication compliance and avoiding drug use.  Plan: Severe persistent asthma: Use your Advair twice a day Use albuterol as needed for chest tightness wheezing and shortness of breath Avoid tobacco use Check serum IgE today Spirometry today Exhaled nitric oxide testing today  Cigarette smoker: We will prescribe Nicotrol inhaler to help you with smoking cessation  Allergic rhinitis: You need to try to suppress your cough to allow your larynx (voice box) to heal.  For three days don't talk, laugh, sing, or clear your throat. Do everything you can to suppress the cough during this time. Use hard candies (sugarless Jolly Ranchers) or non-mint or non-menthol containing cough drops during this time to soothe your throat.  Use a cough suppressant (Delsym or what I have prescribed you) around the clock during this time.  After three days, gradually increase the use of your voice and back off on the cough suppressants.  Methamphetamine use: I am glad you trying her best to get some help, continue to participate in rehab  Follow up in 3 months    Current Outpatient Medications:  .  albuterol (PROVENTIL HFA;VENTOLIN HFA) 108 (90 Base) MCG/ACT inhaler, Inhale 2 puffs into the lungs every 6 (six) hours as needed for wheezing or shortness of breath., Disp: 1 Inhaler, Rfl: 2 .  albuterol (PROVENTIL) (2.5 MG/3ML) 0.083% nebulizer solution, Take 3 mLs (2.5 mg total) by  nebulization every 8 (eight) hours as needed for wheezing or shortness of breath., Disp: 360 mL, Rfl: 5 .  busPIRone (BUSPAR) 5 MG tablet, Take 5 mg by  mouth 2 (two) times daily., Disp: , Rfl:  .  EPINEPHrine 0.3 mg/0.3 mL IJ SOAJ injection, Inject 0.3 mg into the muscle once., Disp: , Rfl:  .  Fluticasone-Salmeterol (ADVAIR) 500-50 MCG/DOSE AEPB, Inhale 1 puff into the lungs 2 (two) times daily., Disp: , Rfl:  .  ibuprofen (ADVIL,MOTRIN) 200 MG tablet, Take 600 mg by mouth every 6 (six) hours as needed for moderate pain. , Disp: , Rfl:  .  montelukast (SINGULAIR) 10 MG tablet, Take 10 mg by mouth daily as needed., Disp: , Rfl:  .  QUEtiapine (SEROQUEL) 50 MG tablet, Take 50 mg by mouth at bedtime., Disp: , Rfl:  .  esomeprazole (NEXIUM) 20 MG packet, Take 20 mg by mouth daily as needed. , Disp: , Rfl:  .  nicotine (NICODERM CQ - DOSED IN MG/24 HOURS) 21 mg/24hr patch, Place 21 mg onto the skin daily as needed (cravings)., Disp: , Rfl:  .  nicotine (NICOTROL) 10 MG inhaler, Inhale 1 Cartridge (1 continuous puffing total) into the lungs as needed for smoking cessation., Disp: 42 each, Rfl: 2 .  nicotine polacrilex (NICORETTE) 2 MG gum, Take 2 mg by mouth as needed for smoking cessation., Disp: , Rfl:

## 2017-09-12 NOTE — Progress Notes (Signed)
Patient completed spiro only today

## 2017-09-15 LAB — IGE: IgE (Immunoglobulin E), Serum: 3325 kU/L — ABNORMAL HIGH (ref <=114)

## 2017-09-16 ENCOUNTER — Telehealth: Payer: Self-pay | Admitting: Pulmonary Disease

## 2017-09-16 NOTE — Telephone Encounter (Signed)
[name], [name], MD sent to [name], [name], CMA        A,  Please let the patient know this showed that his asthma is driven by significant allergy. We will need to discuss more on the next visit.  Thanks,  B    Spoke with [name], pt's mother and notified of results

## 2017-09-16 NOTE — Telephone Encounter (Signed)
Appt to f/u scheduled for 11/05/17 at 3:45 pm

## 2017-09-16 NOTE — Progress Notes (Signed)
See phone note dated 09/16/17

## 2017-09-29 ENCOUNTER — Telehealth: Payer: Self-pay | Admitting: Pulmonary Disease

## 2017-09-29 MED ORDER — EPINEPHRINE 0.3 MG/0.3ML IJ SOAJ: 0.3000 mg | Freq: Once | INTRAMUSCULAR | 5 refills | Status: AC

## 2017-09-29 NOTE — Telephone Encounter (Signed)
Spoke with pt's mother, [name]. States that pt needs a refill on his Epipen. Rx has been sent in. Nothing further was needed.

## 2017-10-03 ENCOUNTER — Encounter: Payer: Self-pay | Admitting: Internal Medicine

## 2017-10-03 ENCOUNTER — Telehealth: Payer: Self-pay | Admitting: Internal Medicine

## 2017-10-03 NOTE — Telephone Encounter (Signed)
3 attempts to schedule fu appt from recall list. Mailed   Deleting recall.

## 2017-11-05 ENCOUNTER — Ambulatory Visit: Payer: Self-pay | Admitting: Pulmonary Disease

## 2017-11-10 ENCOUNTER — Ambulatory Visit: Payer: Self-pay | Admitting: Family Medicine

## 2017-12-09 ENCOUNTER — Ambulatory Visit: Payer: Self-pay | Admitting: Pulmonary Disease

## 2018-01-08 ENCOUNTER — Telehealth: Payer: Self-pay | Admitting: Pulmonary Disease

## 2018-01-08 MED ORDER — FLUTICASONE-SALMETEROL 500-50 MCG/DOSE IN AEPB: 1.0000 | INHALATION_SPRAY | Freq: Two times a day (BID) | RESPIRATORY_TRACT | 5 refills | Status: DC

## 2018-01-08 NOTE — Telephone Encounter (Signed)
Spoke with patient's mother. She stated that the patient needed a refill on his Advair 500-50. This RX needs to go to the [facility] in [place] on [address]. Advised her that I would send in the refill. She verbalized understanding. Nothing else needed at time of call.

## 2018-01-13 ENCOUNTER — Ambulatory Visit: Payer: Self-pay | Admitting: Pulmonary Disease

## 2018-02-24 ENCOUNTER — Ambulatory Visit: Payer: Self-pay | Admitting: Pulmonary Disease

## 2018-03-26 ENCOUNTER — Encounter: Payer: Self-pay | Admitting: Pulmonary Disease

## 2018-03-26 ENCOUNTER — Ambulatory Visit (INDEPENDENT_AMBULATORY_CARE_PROVIDER_SITE_OTHER): Payer: 59 | Admitting: Pulmonary Disease

## 2018-03-26 VITALS — BP 118/80 | HR 94 | Ht 71.0 in | Wt 155.0 lb

## 2018-03-26 DIAGNOSIS — J4550 Severe persistent asthma, uncomplicated: Secondary | ICD-10-CM

## 2018-03-26 DIAGNOSIS — J45909 Unspecified asthma, uncomplicated: Secondary | ICD-10-CM | POA: Diagnosis not present

## 2018-03-26 MED ORDER — ALBUTEROL SULFATE HFA 108 (90 BASE) MCG/ACT IN AERS: 2.0000 | INHALATION_SPRAY | Freq: Four times a day (QID) | RESPIRATORY_TRACT | 2 refills | Status: DC | PRN

## 2018-03-26 MED ORDER — ALBUTEROL SULFATE (2.5 MG/3ML) 0.083% IN NEBU: 2.5000 mg | INHALATION_SOLUTION | Freq: Three times a day (TID) | RESPIRATORY_TRACT | 5 refills | Status: DC | PRN

## 2018-03-26 MED ORDER — MONTELUKAST SODIUM 10 MG PO TABS: 10.0000 mg | ORAL_TABLET | Freq: Every day | ORAL | 3 refills | Status: DC | PRN

## 2018-03-26 MED ORDER — FLUTICASONE-SALMETEROL 500-50 MCG/DOSE IN AEPB
1.0000 | INHALATION_SPRAY | Freq: Two times a day (BID) | RESPIRATORY_TRACT | 5 refills | Status: DC
Start: 2018-03-26 — End: 2019-07-12

## 2018-03-26 NOTE — Patient Instructions (Signed)
Severe persistent asthma with recurrent exacerbations, elevated serum eosinophil count and serum IgE E count: I would like for you to see Dr. Elder Cyphers in [facility] for your asthma Continue Advair 500/50 twice a day Continue Singulair as you are doing Keep using albuterol as needed for chest tightness wheezing or shortness of breath Try your best not to smoke cigarettes or vape In 6 to 8 weeks I think it would be a good ideal to repeat an exhaled nitric oxide test, serum eosinophil count, and serum IgE count so that we can decide whether or not you should use a biologic agent to prevent exacerbations of your asthma Get a flu shot  We will see you back here if you choose to reestablish care with [place] [facility].  For now I recommend that you follow-up with [facility] pulmonary.

## 2018-03-26 NOTE — Progress Notes (Signed)
Subjective:   PATIENT ID: George Becker GENDER: male DOB: 17-Jun-1996, MRN: [id number]  Synopsis: Former patient of Dr. Belia Heman with asthma; he has a past medical history significant for methamphetamine use and IV drug use.  In 2019 he sought rehab in [place] and remains clean as of September 2019.  He still smokes cigarettes 1 pack daily.  He started vaping in August 2019. HPI  Chief Complaint  Patient presents with  . Follow-up    wheezing but feels more controlled    Marks says he is good, however two weeks ago he caught a cold from coworkers.  He had to go to urgent care and was treated for bronchitis with prednisone.  He says his O2 saturation was 88%.  He has been taking the Advair and singulair.  He had to take an antibiotic.    He is still smoking 1/2-1 pack of cigarettes.  He vapes some during the daytime.    6 he is still taking Advair regularly and Singulair regularly.  Past Medical History:  Diagnosis Date  . Anxiety   . Asthma       Review of Systems  Constitutional: Negative for chills, fever, malaise/fatigue and weight loss.  HENT: Positive for congestion. Negative for nosebleeds, sinus pain and sore throat.   Eyes: Negative for photophobia, pain and discharge.  Respiratory: Positive for cough, sputum production, shortness of breath and wheezing. Negative for hemoptysis.   Cardiovascular: Negative for chest pain, palpitations, orthopnea and leg swelling.  Gastrointestinal: Negative for abdominal pain, constipation, diarrhea, nausea and vomiting.  Genitourinary: Negative for dysuria, frequency, hematuria and urgency.  Musculoskeletal: Negative for back pain, joint pain, myalgias and neck pain.  Skin: Negative for itching and rash.  Neurological: Negative for tingling, tremors, sensory change, speech change, focal weakness, seizures, weakness and headaches.  Psychiatric/Behavioral: Negative for memory loss, substance abuse and suicidal ideas. The  patient is not nervous/anxious.       Objective:  Physical Exam   Vitals:   03/26/18 1147  BP: 118/80  Pulse: 94  SpO2: 98%  Weight: 155 lb (70.3 kg)  Height: 5\' 11"  (1.803 m)   RA  Gen: well appearing HENT: OP clear, TM's clear, neck supple PULM: CTA B, normal percussion CV: RRR, no mgr, trace edema GI: BS+, soft, nontender Derm: no cyanosis or rash Psyche: normal mood and affect   CBC    Component Value Date/Time   WBC 13.9 (H) 09/06/2017 0410   RBC 5.60 09/06/2017 0410   HGB 17.2 (H) 09/06/2017 0410   HCT 48.3 09/06/2017 0410   PLT 329 09/06/2017 0410   MCV 86.3 09/06/2017 0410   MCH 30.7 09/06/2017 0410   MCHC 35.6 09/06/2017 0410   RDW 13.3 09/06/2017 0410   LYMPHSABS 5.2 (H) 09/06/2017 0410   MONOABS 0.8 09/06/2017 0410   EOSABS 1.1 (H) 09/06/2017 0410   BASOSABS 0.1 09/06/2017 0410     Chest imaging: March 2019 chest x-ray images independently reviewed showing normal pulmonary parenchyma, normal cardiac silhouette  PFT: March 2015 ratio 60%, FEV1 3.62 L 75% predicted  Labs: March 2019 serum IgE 3,325, absolute eosinophil count 1000  Path:  Echo:  Heart Catheterization:       Assessment & Plan:   Uncomplicated asthma, unspecified asthma severity, unspecified whether persistent - Plan: Spirometry with Graph  Severe persistent chronic asthma without complication  Discussion: Trevan has severe persistent asthma with recurrent exacerbations.  In March while he was still using drugs he  had a markedly elevated serum eosinophil count and serum IgE count.  Since then he has had one exacerbation.  My hope is the further he gets away from illicit drug use the fewer exacerbations he will experience.  However, considering the dramatic abnormalities of these tests I do strongly consider whether or not we should use an IL 4/13 antibody inhibitor.  At this point because of his somewhat transient living condition and the fact that he is staying away from  drugs it is not clear to me if we should start this or not.  Because he is taking prednisone right now I cannot check in the normal objective data that I would use to guide this decision.  He refused a flu shot today.  Plan: Severe persistent asthma with recurrent exacerbations, elevated serum eosinophil count and serum IgE E count: I would like for you to see Dr. Elder Cyphers in [facility] for your asthma Continue Advair 500/50 twice a day Continue Singulair as you are doing Keep using albuterol as needed for chest tightness wheezing or shortness of breath Try your best not to smoke cigarettes or vape In 6 to 8 weeks I think it would be a good ideal to repeat an exhaled nitric oxide test, serum eosinophil count, and serum IgE count so that we can decide whether or not you should use a biologic agent to prevent exacerbations of your asthma Get a flu shot  We will see you back here if you choose to reestablish care with [place] [facility].  For now I recommend that you follow-up with [facility] pulmonary.  > 50% of this 30 minute visit spent face to face   Current Outpatient Medications:  .  albuterol (PROVENTIL HFA;VENTOLIN HFA) 108 (90 Base) MCG/ACT inhaler, Inhale 2 puffs into the lungs every 6 (six) hours as needed for wheezing or shortness of breath., Disp: 3 Inhaler, Rfl: 2 .  albuterol (PROVENTIL) (2.5 MG/3ML) 0.083% nebulizer solution, Take 3 mLs (2.5 mg total) by nebulization every 8 (eight) hours as needed for wheezing or shortness of breath., Disp: 360 mL, Rfl: 5 .  esomeprazole (NEXIUM) 20 MG packet, Take 20 mg by mouth daily as needed. , Disp: , Rfl:  .  Fluticasone-Salmeterol (ADVAIR) 500-50 MCG/DOSE AEPB, Inhale 1 puff into the lungs 2 (two) times daily., Disp: 60 each, Rfl: 5 .  ibuprofen (ADVIL,MOTRIN) 200 MG tablet, Take 600 mg by mouth every 6 (six) hours as needed for moderate pain. , Disp: , Rfl:  .  montelukast (SINGULAIR) 10 MG tablet, Take 1 tablet  (10 mg total) by mouth daily as needed., Disp: 90 tablet, Rfl: 3 .  nicotine (NICODERM CQ - DOSED IN MG/24 HOURS) 21 mg/24hr patch, Place 21 mg onto the skin daily as needed (cravings)., Disp: , Rfl:  .  nicotine (NICOTROL) 10 MG inhaler, Inhale 1 Cartridge (1 continuous puffing total) into the lungs as needed for smoking cessation., Disp: 42 each, Rfl: 2 .  nicotine polacrilex (NICORETTE) 2 MG gum, Take 2 mg by mouth as needed for smoking cessation., Disp: , Rfl:  .  QUEtiapine (SEROQUEL) 100 MG tablet, TAKE 1 TABLET BY MOUTH EVERYDAY AT BEDTIME, Disp: , Rfl: 0 .  sertraline (ZOLOFT) 100 MG tablet, Take 100 mg by mouth daily., Disp: , Rfl: 1 .  valACYclovir (VALTREX) 1000 MG tablet, TAKE 1 TABLET (1,000 MG) BY MOUTH DAILY, Disp: , Rfl: 99

## 2018-12-07 IMAGING — CT CT ANGIO CHEST
2 of 6 series · 18 of 36 positions shown · IV contrast (ISOVUE 370)
Comparison: Chest radiograph 05/27/2017

CLINICAL DATA: Shortness of breath

EXAM:
CT ANGIOGRAPHY CHEST WITH CONTRAST
TECHNIQUE: Multidetector CT imaging of the chest was performed using the
standard protocol during bolus administration of intravenous
contrast. Multiplanar CT image reconstructions and MIPs were
obtained to evaluate the vascular anatomy.
CONTRAST:  100 mL Isovue 370

[Series 6: thins for pacs · axial · 0.67mm/px · z∈[+1350,+1613]mm · 17 of 293 slices shown]
[im 15/293  lung]
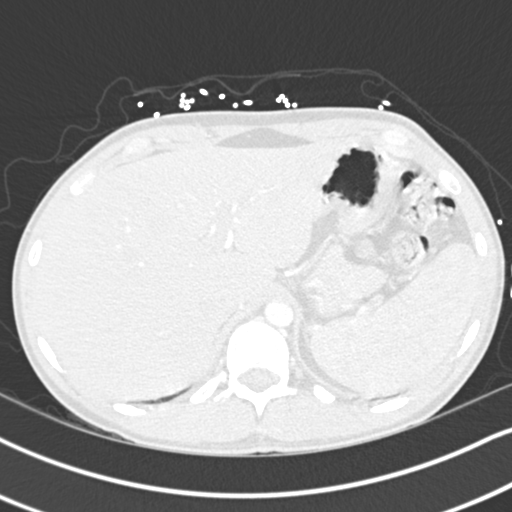
[im 30/293  mediastinal]
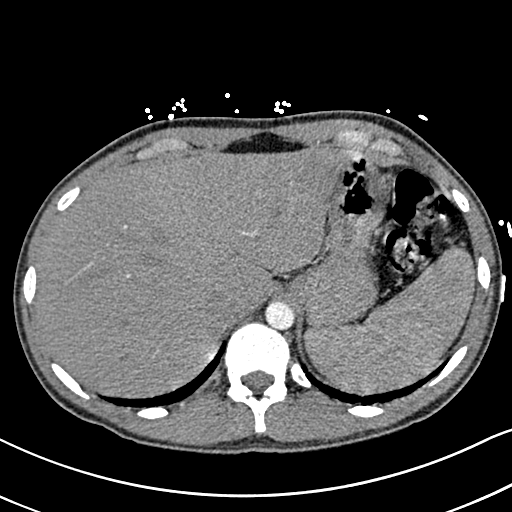
[im 44/293  lung]
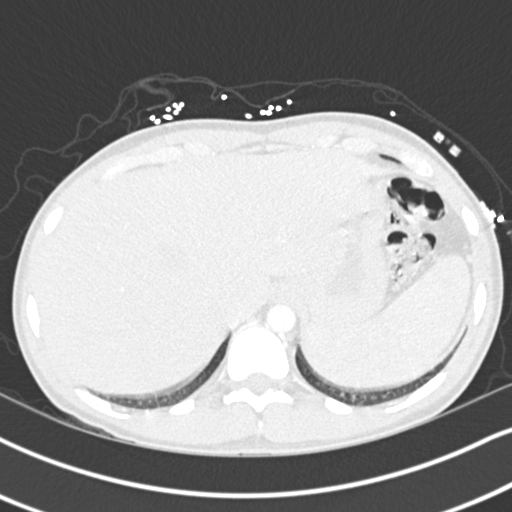
[im 59/293  mediastinal]
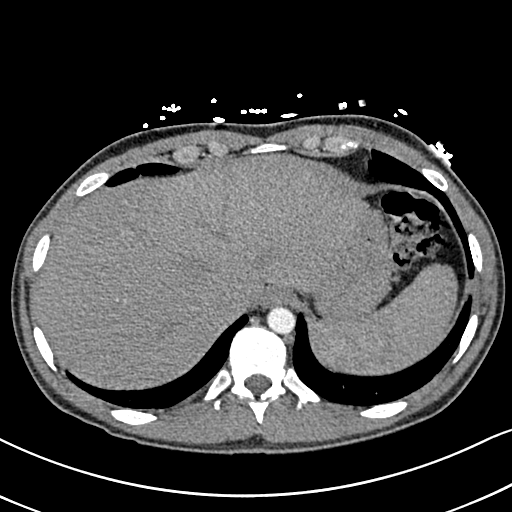
[im 88/293  lung]
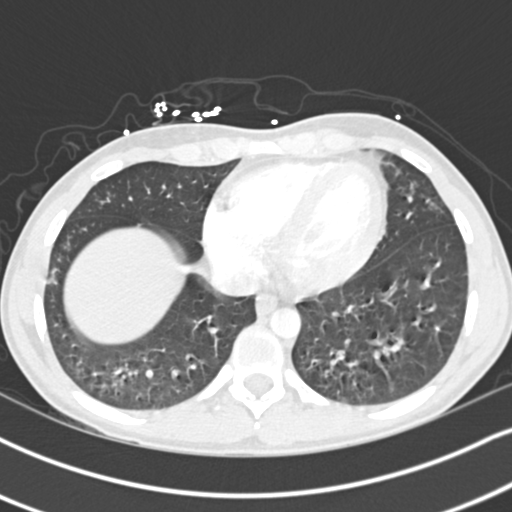
[im 103/293  mediastinal]
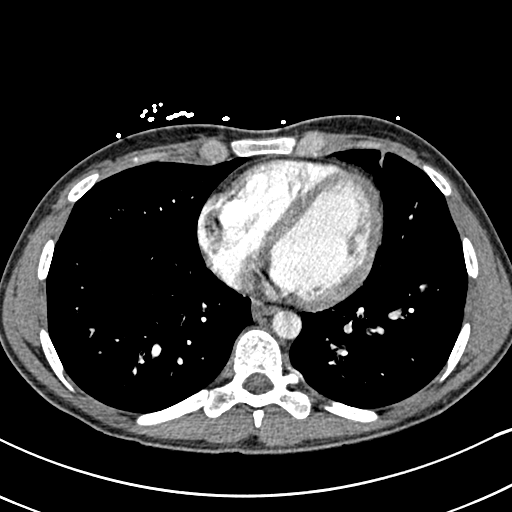
[im 117/293  lung]
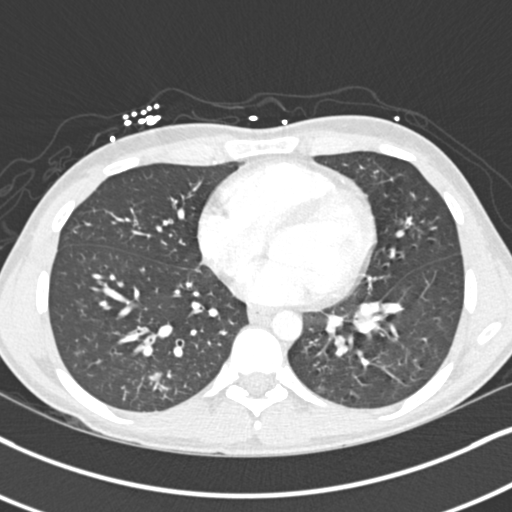
[im 132/293  mediastinal]
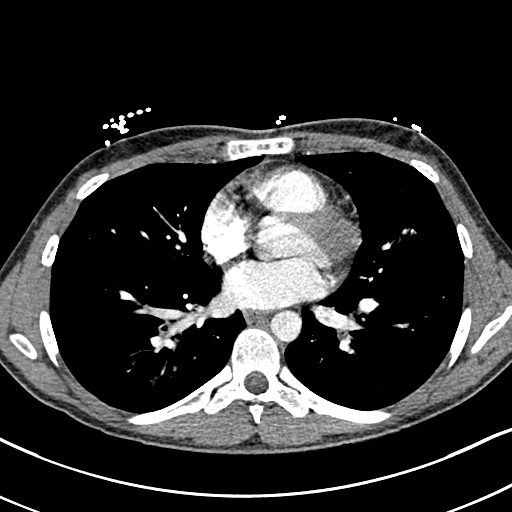
[im 147/293  lung]
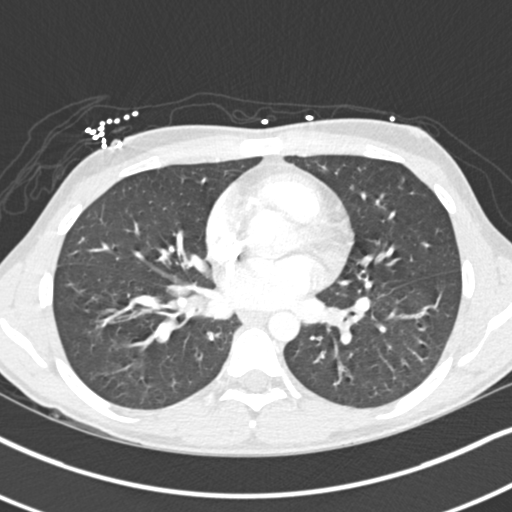
[im 161/293  mediastinal]
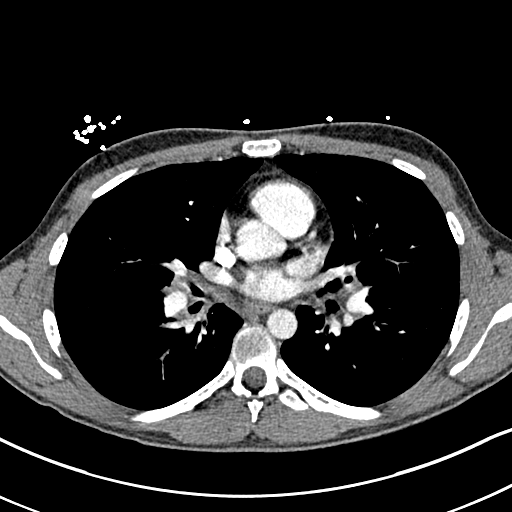
[im 176/293  lung]
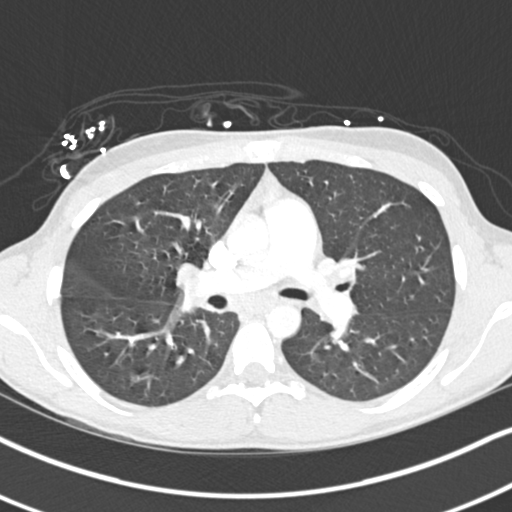
[im 190/293  mediastinal]
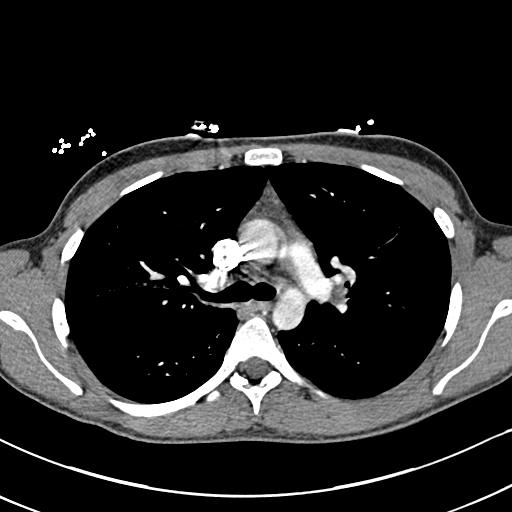
[im 205/293  lung]
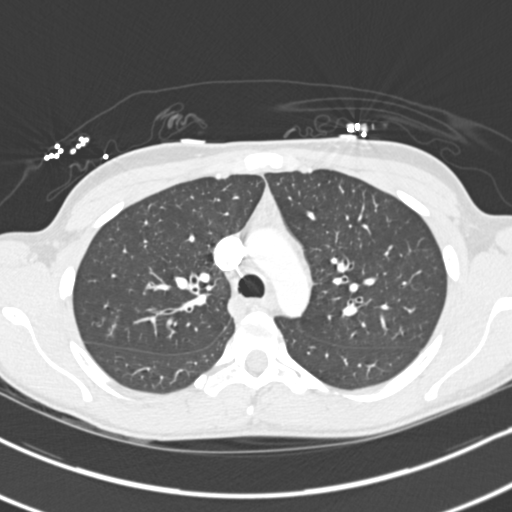
[im 234/293  mediastinal]
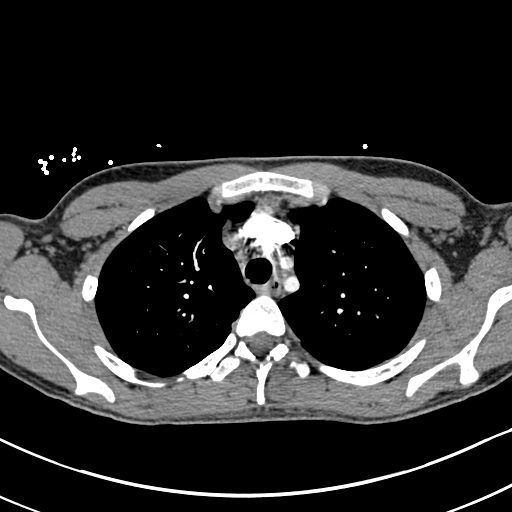
[im 249/293  lung]
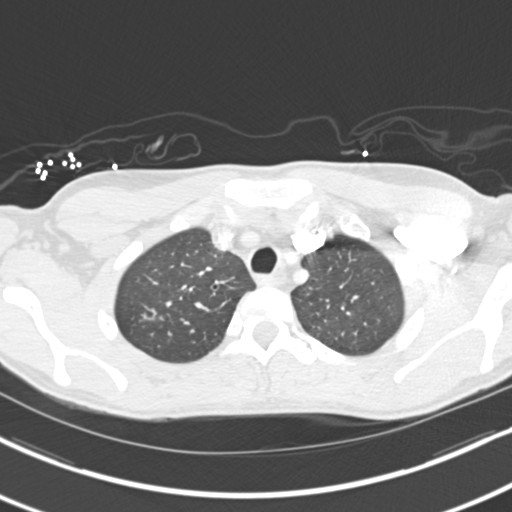
[im 263/293  mediastinal]
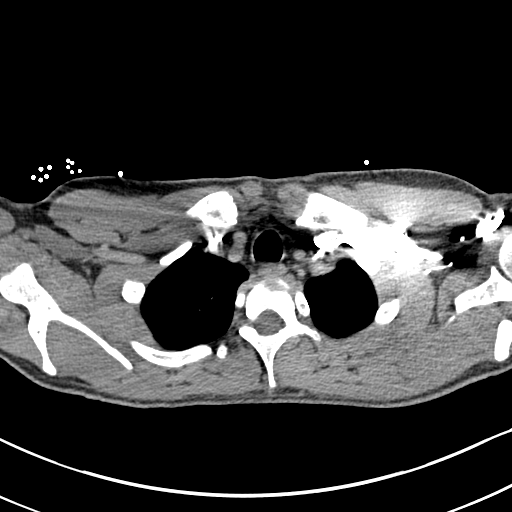
[im 278/293  lung]
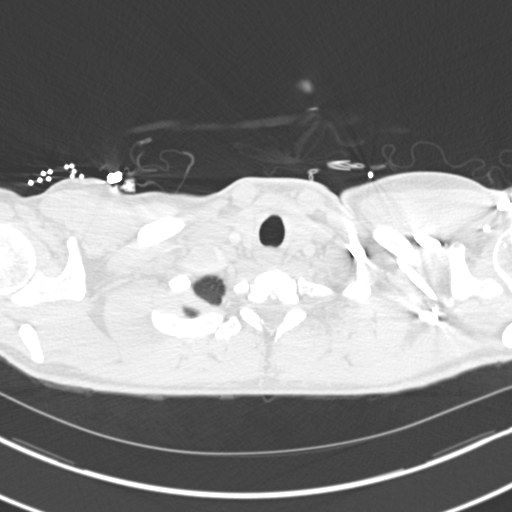

[Series 8: coronal mpr · coronal · 0.57mm/px · 1 of 107 slices shown]
[im 54/107  mediastinal]
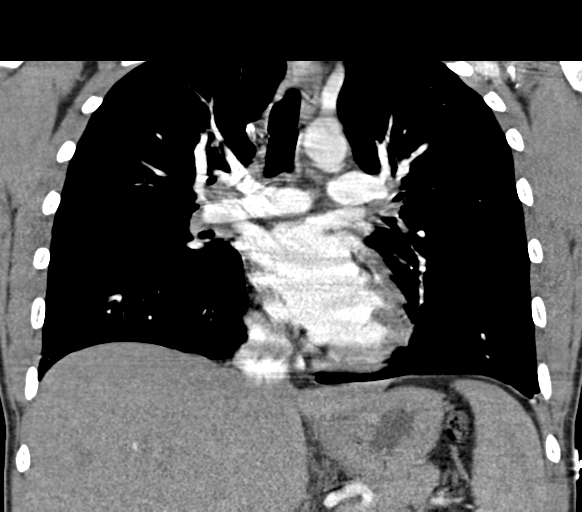

[18 of 36 positions shown; findings below may reference images not displayed]

FINDINGS: Cardiovascular: Contrast injection is sufficient to demonstrate
satisfactory opacification of the pulmonary arteries to the
segmental level. There is no pulmonary embolus. The main pulmonary
artery is within normal limits for size. There is no CT evidence of
acute right heart strain. The visualized aorta is normal. There is a
normal 3-vessel arch branching pattern. Heart size is normal,
without pericardial effusion.

Mediastinum/Nodes: No mediastinal, hilar or axillary
lymphadenopathy. The visualized thyroid and thoracic esophageal
course are unremarkable.

Lungs/Pleura: There is thickening of the central airways,
particularly at the lobar and segmental levels. No focal airspace
consolidation. There are multiple nodular and tree-in-bud opacities
in the inferior lingula and both lower lobes, right worse than left.
No pleural effusion.

Upper Abdomen: Contrast bolus timing is not optimized for evaluation
of the abdominal organs. Within this limitation, the visualized
organs of the upper abdomen are normal.

Musculoskeletal: No chest wall abnormality. No acute or significant
osseous findings.

Review of the MIP images confirms the above findings.
IMPRESSION: 1. No pulmonary embolus or acute aortic syndrome.
2. Thickening of the central airway is with nodular and tree-in-bud
opacities of both lung bases and the inferior lingula. These
findings may be secondary to a viral infectious process or to
aspiration. Reactive airway disease could cause the peribronchial
thickening, but would not be expected to cause the tree-in-bud
opacities.

## 2019-04-15 ENCOUNTER — Other Ambulatory Visit: Payer: Self-pay | Admitting: Pulmonary Disease

## 2019-04-16 ENCOUNTER — Other Ambulatory Visit: Payer: Self-pay | Admitting: Pulmonary Disease

## 2019-04-19 ENCOUNTER — Other Ambulatory Visit: Payer: Self-pay | Admitting: Pulmonary Disease

## 2019-07-12 ENCOUNTER — Encounter: Payer: Self-pay | Admitting: Primary Care

## 2019-07-12 ENCOUNTER — Other Ambulatory Visit: Payer: Self-pay

## 2019-07-12 ENCOUNTER — Ambulatory Visit (INDEPENDENT_AMBULATORY_CARE_PROVIDER_SITE_OTHER): Payer: No Typology Code available for payment source | Admitting: Primary Care

## 2019-07-12 VITALS — BP 112/62 | HR 91 | Temp 97.0°F | Ht 71.0 in | Wt 152.8 lb

## 2019-07-12 DIAGNOSIS — F17210 Nicotine dependence, cigarettes, uncomplicated: Secondary | ICD-10-CM

## 2019-07-12 DIAGNOSIS — F1910 Other psychoactive substance abuse, uncomplicated: Secondary | ICD-10-CM | POA: Diagnosis not present

## 2019-07-12 DIAGNOSIS — J4550 Severe persistent asthma, uncomplicated: Secondary | ICD-10-CM

## 2019-07-12 LAB — CBC WITH DIFFERENTIAL/PLATELET
Basophils Absolute: 0 10*3/uL (ref 0.0–0.1)
Basophils Relative: 0.5 % (ref 0.0–3.0)
Eosinophils Absolute: 1 10*3/uL — ABNORMAL HIGH (ref 0.0–0.7)
Eosinophils Relative: 16.1 % — ABNORMAL HIGH (ref 0.0–5.0)
HCT: 39.8 % (ref 39.0–52.0)
Hemoglobin: 13.5 g/dL (ref 13.0–17.0)
Lymphocytes Relative: 33.3 % (ref 12.0–46.0)
Lymphs Abs: 2.1 10*3/uL (ref 0.7–4.0)
MCHC: 33.8 g/dL (ref 30.0–36.0)
MCV: 87.2 fl (ref 78.0–100.0)
Monocytes Absolute: 0.6 10*3/uL (ref 0.1–1.0)
Monocytes Relative: 8.9 % (ref 3.0–12.0)
Neutro Abs: 2.6 10*3/uL (ref 1.4–7.7)
Neutrophils Relative %: 41.2 % — ABNORMAL LOW (ref 43.0–77.0)
Platelets: 201 10*3/uL (ref 150.0–400.0)
RBC: 4.57 Mil/uL (ref 4.22–5.81)
RDW: 13.6 % (ref 11.5–15.5)
WBC: 6.4 10*3/uL (ref 4.0–10.5)

## 2019-07-12 MED ORDER — MONTELUKAST SODIUM 10 MG PO TABS: 10.0000 mg | ORAL_TABLET | Freq: Every day | ORAL | 3 refills | Status: DC | PRN

## 2019-07-12 MED ORDER — FLUTICASONE-SALMETEROL 500-50 MCG/DOSE IN AEPB: 1.0000 | INHALATION_SPRAY | Freq: Two times a day (BID) | RESPIRATORY_TRACT | 5 refills | Status: DC

## 2019-07-12 MED ORDER — ALBUTEROL SULFATE HFA 108 (90 BASE) MCG/ACT IN AERS: 2.0000 | INHALATION_SPRAY | Freq: Four times a day (QID) | RESPIRATORY_TRACT | 3 refills | Status: DC | PRN

## 2019-07-12 MED ORDER — ALBUTEROL SULFATE (2.5 MG/3ML) 0.083% IN NEBU: 2.5000 mg | INHALATION_SOLUTION | Freq: Three times a day (TID) | RESPIRATORY_TRACT | 5 refills | Status: AC | PRN

## 2019-07-12 NOTE — Assessment & Plan Note (Addendum)
-   Poorly controlled. Inconsistent use of maintenance inhalers. Asthma symptoms triggered by seasonal changes and tobacco smoke  - Treated for two asthma exacerbations, most recent being in November at [facility] ED with Doxycycline/prednisone taper  - Continue Advair 500/50 one puff twice daily (rinse mouth after) and Singulair 10mg  daily (refills sent) - Continue Albuterol hfa/neb every 6 hours for breakthrough shortness of breath/wheezing  - Educated patient on importance and benefit of regular use inhalers - 07/12/2019 Eosinophils absolute 1000; IgE pending  - Continue to encourage smoking cessation  - May need to consider addition biologic therapy, compliance is an ongoing obstacle  - FU in 3 months with Dr. 09/09/2019 in [facility] or [facility] in [place] (new patient)

## 2019-07-12 NOTE — Progress Notes (Signed)
$'@Patient'O$  ID: George Becker, male    DOB: Aug 28, 1995, 24 y.o.   MRN: [id number]  Chief Complaint  Patient presents with  . Follow-up    Referring provider: Ander Slade, MD  HPI: 46 year, current smoker. PMH severe persistent asthma with recurrent exacerbations, polysubstance abuse, depression with anxiety. Patient of Dr. Lake Bells, last seen on 03/26/18. Elevated eosinophils and IgE. Maintained on Advair 500/50 twice daily, prn albuterol hfa/neb and singulair.   07/12/2019 Patient presents today for annual follow-up for asthma. Seen at [facility] ED in November for accidental drug overdose and asthma exacerbation requiring narcan. Discharged with prednisone taper and doxycycline. He has a long standing history of polysubstance abuse. He has been receiving treatment in [place] for substance abuse and recently just return to the area. He does not currently have PCP. States that his asthma has been ok. He admits to not using his inhalers requirly as prescribed. His asthma symptoms worsen with season change (typically summer/fall). He was seen at an UC around this time for asthma exacerbation requiring oral prednisone. He was also treated for strep throat with antibiotic. He has been needing to use his Albuterol rescue inhaler/nebulizer more. He ran out of Singulair and Advair three weeks ago. He has a chronic cough d/t smoking, no significant production. No active shortness of breath or wheezing today.    Allergies  Allergen Reactions  . Amoxicillin Anaphylaxis and Hives  . Augmentin [Amoxicillin-Pot Clavulanate] Anaphylaxis and Hives  . Other Anaphylaxis    "all Nuts"  . Peanut Butter Flavor     Immunization History  Administered Date(s) Administered  . DTaP 05/30/1997, 08/01/1997, 09/20/1997, 06/29/1998, 12/29/2001  . HPV 9-valent 01/23/2015, 03/28/2015, 08/02/2015  . HPV Quadrivalent 01/23/2015, 03/28/2015, 08/02/2015  . Hepatitis A 09/01/2012, 12/14/2014  . Hepatitis A,  Ped/Adol-2 Dose 12/14/2014  . Hepatitis B 04/01/1997, 05/30/1997, 12/21/1997  . HiB (PRP-OMP) 05/30/1997, 08/01/1997, 09/20/1997, 06/29/1998  . IPV 05/30/1997, 08/01/1997, 04/03/1998, 12/29/2001  . Influenza-Unspecified 05/21/2005, 05/14/2006, 04/14/2007, 04/19/2008, 05/23/2010, 04/21/2012  . MMR 06/29/1998, 12/29/2001  . Meningococcal Conjugate 12/14/2014  . Meningococcal Polysaccharide 09/01/2012  . Rotavirus Pentavalent 08/01/1997, 10/31/1997, 12/21/1997  . Tdap 09/15/2008  . Varicella 04/03/1998, 06/19/2006    Past Medical History:  Diagnosis Date  . Anxiety   . Asthma     Tobacco History: Social History   Tobacco Use  Smoking Status Current Every Day Smoker  . Packs/day: 1.00  . Years: 4.00  . Pack years: 4.00  . Types: Cigarettes  Smokeless Tobacco Never Used   Ready to quit: Not Answered Counseling given: Not Answered   Outpatient Medications Prior to Visit  Medication Sig Dispense Refill  . esomeprazole (NEXIUM) 20 MG packet Take 20 mg by mouth daily as needed.     Marland Kitchen ibuprofen (ADVIL,MOTRIN) 200 MG tablet Take 600 mg by mouth every 6 (six) hours as needed for moderate pain.     . nicotine (NICODERM CQ - DOSED IN MG/24 HOURS) 21 mg/24hr patch Place 21 mg onto the skin daily as needed (cravings).    . nicotine (NICOTROL) 10 MG inhaler Inhale 1 Cartridge (1 continuous puffing total) into the lungs as needed for smoking cessation. 42 each 2  . nicotine polacrilex (NICORETTE) 2 MG gum Take 2 mg by mouth as needed for smoking cessation.    . QUEtiapine (SEROQUEL) 100 MG tablet TAKE 1 TABLET BY MOUTH EVERYDAY AT BEDTIME  0  . sertraline (ZOLOFT) 100 MG tablet Take 100 mg by mouth daily.  1  . valACYclovir (  VALTREX) 1000 MG tablet TAKE 1 TABLET (1,000 MG) BY MOUTH DAILY  99  . albuterol (PROVENTIL HFA;VENTOLIN HFA) 108 (90 Base) MCG/ACT inhaler Inhale 2 puffs into the lungs every 6 (six) hours as needed for wheezing or shortness of breath. 3 Inhaler 2  . albuterol  (PROVENTIL) (2.5 MG/3ML) 0.083% nebulizer solution Take 3 mLs (2.5 mg total) by nebulization every 8 (eight) hours as needed for wheezing or shortness of breath. 360 mL 5  . Fluticasone-Salmeterol (ADVAIR) 500-50 MCG/DOSE AEPB Inhale 1 puff into the lungs 2 (two) times daily. 60 each 5  . montelukast (SINGULAIR) 10 MG tablet Take 1 tablet (10 mg total) by mouth daily as needed. 90 tablet 3   No facility-administered medications prior to visit.   Review of Systems  Review of Systems  Constitutional: Negative.   HENT: Negative.   Respiratory: Positive for cough and shortness of breath.   Cardiovascular: Negative.   Psychiatric/Behavioral: Negative.    Physical Exam  BP 112/62 (BP Location: Left Arm, Cuff Size: Normal)   Pulse 91   Temp (!) 97 F (36.1 C) (Oral)   Ht 5' 11"  (1.803 m)   Wt 152 lb 12.8 oz (69.3 kg)   SpO2 100%   BMI 21.31 kg/m  Physical Exam Constitutional:      General: He is not in acute distress.    Appearance: Normal appearance. He is normal weight. He is not ill-appearing.  HENT:     Head: Normocephalic and atraumatic.     Nose: Nose normal.     Mouth/Throat:     Comments: Deferred d/t masking Eyes:     Conjunctiva/sclera: Conjunctivae normal.     Pupils: Pupils are equal, round, and reactive to light.  Cardiovascular:     Rate and Rhythm: Normal rate and regular rhythm.  Pulmonary:     Effort: Pulmonary effort is normal.     Breath sounds: Normal breath sounds. No wheezing or rhonchi.  Musculoskeletal:        General: Normal range of motion.     Cervical back: Normal range of motion.  Skin:    General: Skin is warm and dry.  Neurological:     General: No focal deficit present.     Mental Status: He is alert and oriented to person, place, and time. Mental status is at baseline.     Comments: Alert and oriented  Psychiatric:        Mood and Affect: Mood normal.        Behavior: Behavior normal.        Thought Content: Thought content normal.          Judgment: Judgment normal.     Comments: Pleasant mood; no significant signs of anxiety or depression on exam today       Lab Results:  CBC    Component Value Date/Time   WBC 6.4 07/12/2019 1108   RBC 4.57 07/12/2019 1108   HGB 13.5 07/12/2019 1108   HCT 39.8 07/12/2019 1108   PLT 201.0 07/12/2019 1108   MCV 87.2 07/12/2019 1108   MCH 30.7 09/06/2017 0410   MCHC 33.8 07/12/2019 1108   RDW 13.6 07/12/2019 1108   LYMPHSABS 2.1 07/12/2019 1108   MONOABS 0.6 07/12/2019 1108   EOSABS 1.0 (H) 07/12/2019 1108   BASOSABS 0.0 07/12/2019 1108    BMET    Component Value Date/Time   NA 143 09/06/2017 0410   K 3.5 09/06/2017 0410   CL 104 09/06/2017 0410   CO2  21 (L) 09/06/2017 0410   GLUCOSE 125 (H) 09/06/2017 0410   BUN 5 (L) 09/06/2017 0410   CREATININE 0.65 09/06/2017 0410   CREATININE 0.67 10/03/2016 2055   CALCIUM 9.4 09/06/2017 0410   GFRNONAA >60 09/06/2017 0410   GFRNONAA >89 10/03/2016 2055   GFRAA >60 09/06/2017 0410   GFRAA >89 10/03/2016 2055    BNP No results found for: BNP  ProBNP No results found for: PROBNP  Imaging: No results found.   Assessment & Plan:   Asthma - Poorly controlled. Inconsistent use of maintenance inhalers. Asthma symptoms triggered by seasonal changes and tobacco smoke  - Treated for two asthma exacerbations, most recent being in November at [facility] ED with Doxycycline/prednisone taper  - Continue Advair 500/50 one puff twice daily (rinse mouth after) and Singulair 67m daily (refills sent) - Continue Albuterol hfa/neb every 6 hours for breakthrough shortness of breath/wheezing  - Educated patient on importance and benefit of regular use inhalers - 07/12/2019 Eosinophils absolute 1000; IgE pending  - Continue to encourage smoking cessation  - May need to consider addition biologic therapy, compliance is an ongoing obstacle  - [facility] [place]  Continuous dependence on cigarette smoking - Active smoker, he is not ready to quit but would like to in the future  - When patient is ready recommend pharmacy consult for smoking cessation   [place] Hx longstanding polysubstance abuse, denies current use  [facility]

## 2019-07-12 NOTE — Patient Instructions (Addendum)
It was such a pleasure meeting you today Mr. George Becker I commend you on all the hard work you have done in getting healthy When you are ready to cut back or quit smoking let us know so we can help (there is a pharmacist on site here that can help with smoking cessation)  Recommendations: - Continue Advair 1 puff every 12 hours every day (rinse mouth after use) - Continue Singulair 10mg  at bedtime - Use albuterol rescue inhaler 2 puff every 4-6 hour as needed for breakthrough shortness of breath - If rescue inhaler not effective use Albuterol nebulizer treatment every 6 hours for sob/wheezing   Orders: - CBC with diff, IgE  Referral: - Needs to establish with PCP   Follow-up: - 3 months with either Dr. Mortimer Fries ( in [place]) or Dr. Earma Reading as new patient George Becker) - Or sooner if having worsening symptoms or flare of your asthma      Asthma, Adult  Asthma is a long-term (chronic) condition in which the airways get tight and narrow. The airways are the breathing passages that lead from the nose and mouth down into the lungs. A person with asthma will have times when symptoms get worse. These are called asthma attacks. They can cause coughing, whistling sounds when you breathe (wheezing), shortness of breath, and chest pain. They can make it hard to breathe. There is no cure for asthma, but medicines and lifestyle changes can help control it. There are many things that can bring on an asthma attack or make asthma symptoms worse (triggers). Common triggers include:  Mold.  Dust.  Cigarette smoke.  Cockroaches.  Things that can cause allergy symptoms (allergens). These include animal skin flakes (dander) and pollen from trees or grass.  Things that pollute the air. These may include household cleaners, wood smoke, smog, or chemical odors.  Cold air, weather changes, and wind.  Crying or laughing hard.  Stress.  Certain medicines or drugs.  Certain foods such as dried  fruit, potato chips, and grape juice.  Infections, such as a cold or the flu.  Certain medical conditions or diseases.  Exercise or tiring activities. Asthma may be treated with medicines and by staying away from the things that cause asthma attacks. Types of medicines may include:  Controller medicines. These help prevent asthma symptoms. They are usually taken every day.  Fast-acting reliever or rescue medicines. These quickly relieve asthma symptoms. They are used as needed and provide short-term relief.  Allergy medicines if your attacks are brought on by allergens.  Medicines to help control the body's defense (immune) system. Follow these instructions at home: Avoiding triggers in your home  Change your heating and air conditioning filter often.  Limit your use of fireplaces and wood stoves.  Get rid of pests (such as roaches and mice) and their droppings.  Throw away plants if you see mold on them.  Clean your floors. Dust regularly. Use cleaning products that do not smell.  Have someone vacuum when you are not home. Use a vacuum cleaner with a HEPA filter if possible.  Replace carpet with wood, tile, or vinyl flooring. Carpet can trap animal skin flakes and dust.  Use allergy-proof pillows, mattress covers, and box spring covers.  Wash bed sheets and blankets every week in hot water. Dry them in a dryer.  Keep your bedroom free of any triggers.  Avoid pets and keep windows closed when things that cause allergy symptoms are in the air.  Use blankets that are  made of polyester or cotton.  Clean bathrooms and kitchens with bleach. If possible, have someone repaint the walls in these rooms with mold-resistant paint. Keep out of the rooms that are being cleaned and painted.  Wash your hands often with soap and water. If soap and water are not available, use hand sanitizer.  Do not allow anyone to smoke in your home. General instructions  Take over-the-counter and  prescription medicines only as told by your doctor. ? Talk with your doctor if you have questions about how or when to take your medicines. ? Make note if you need to use your medicines more often than usual.  Do not use any products that contain nicotine or tobacco, such as cigarettes and e-cigarettes. If you need help quitting, ask your doctor.  Stay away from secondhand smoke.  Avoid doing things outdoors when allergen counts are high and when air quality is low.  Wear a ski mask when doing outdoor activities in the winter. The mask should cover your nose and mouth. Exercise indoors on cold days if you can.  Warm up before you exercise. Take time to cool down after exercise.  Use a peak flow meter as told by your doctor. A peak flow meter is a tool that measures how well the lungs are working.  Keep track of the peak flow meter's readings. Write them down.  Follow your asthma action plan. This is a written plan for taking care of your asthma and treating your attacks.  Make sure you get all the shots (vaccines) that your doctor recommends. Ask your doctor about a flu shot and a pneumonia shot.  Keep all follow-up visits as told by your doctor. This is important. Contact a doctor if:  You have wheezing, shortness of breath, or a cough even while taking medicine to prevent attacks.  The mucus you cough up (sputum) is thicker than usual.  The mucus you cough up changes from clear or white to yellow, green, gray, or bloody.  You have problems from the medicine you are taking, such as: ? A rash. ? Itching. ? Swelling. ? Trouble breathing.  You need reliever medicines more than 2-3 times a week.  Your peak flow reading is still at 50-79% of your personal best after following the action plan for 1 hour.  You have a fever. Get help right away if:  You seem to be worse and are not responding to medicine during an asthma attack.  You are short of breath even at rest.  You get  short of breath when doing very little activity.  You have trouble eating, drinking, or talking.  You have chest pain or tightness.  You have a fast heartbeat.  Your lips or fingernails start to turn blue.  You are light-headed or dizzy, or you faint.  Your peak flow is less than 50% of your personal best.  You feel too tired to breathe normally. Summary  Asthma is a long-term (chronic) condition in which the airways get tight and narrow. An asthma attack can make it hard to breathe.  Asthma cannot be cured, but medicines and lifestyle changes can help control it.  Make sure you understand how to avoid triggers and how and when to use your medicines. This information is not intended to replace advice given to you by your health care provider. Make sure you discuss any questions you have with your health care provider. Document Revised: 08/20/2018 Document Reviewed: 07/22/2016 Elsevier Patient Education  2020 Elsevier  Inc.  Steps to Quit Smoking Smoking tobacco is the leading cause of preventable death. It can affect almost every organ in the body. Smoking puts you and people around you at risk for many serious, long-lasting (chronic) diseases. Quitting smoking can be hard, but it is one of the best things that you can do for your health. It is never too late to quit. How do I get ready to quit? When you decide to quit smoking, make a plan to help you succeed. Before you quit:  Pick a date to quit. Set a date within the next 2 weeks to give you time to prepare.  Write down the reasons why you are quitting. Keep this list in places where you will see it often.  Tell your family, friends, and co-workers that you are quitting. Their support is important.  Talk with your doctor about the choices that may help you quit.  Find out if your health insurance will pay for these treatments.  Know the people, places, things, and activities that make you want to smoke (triggers). Avoid  them. What first steps can I take to quit smoking?  Throw away all cigarettes at home, at work, and in your car.  Throw away the things that you use when you smoke, such as ashtrays and lighters.  Clean your car. Make sure to empty the ashtray.  Clean your home, including curtains and carpets. What can I do to help me quit smoking? Talk with your doctor about taking medicines and seeing a counselor at the same time. You are more likely to succeed when you do both.  If you are pregnant or breastfeeding, talk with your doctor about counseling or other ways to quit smoking. Do not take medicine to help you quit smoking unless your doctor tells you to do so. To quit smoking: Quit right away  Quit smoking totally, instead of slowly cutting back on how much you smoke over a period of time.  Go to counseling. You are more likely to quit if you go to counseling sessions regularly. Take medicine You may take medicines to help you quit. Some medicines need a prescription, and some you can buy over-the-counter. Some medicines may contain a drug called nicotine to replace the nicotine in cigarettes. Medicines may:  Help you to stop having the desire to smoke (cravings).  Help to stop the problems that come when you stop smoking (withdrawal symptoms). Your doctor may ask you to use:  Nicotine patches, gum, or lozenges.  Nicotine inhalers or sprays.  Non-nicotine medicine that is taken by mouth. Find resources Find resources and other ways to help you quit smoking and remain smoke-free after you quit. These resources are most helpful when you use them often. They include:  Online chats with a Veterinary surgeon.  Phone quitlines.  Printed Materials engineer.  Support groups or group counseling.  Text messaging programs.  Mobile phone apps. Use apps on your mobile phone or tablet that can help you stick to your quit plan. There are many free apps for mobile phones and tablets as well as  websites. Examples include Quit Guide from the [employer] and smokefree.gov  What things can I do to make it easier to quit?   Talk to your family and friends. Ask them to support and encourage you.  Call a phone quitline (1-800-QUIT-NOW), reach out to support groups, or work with a Veterinary surgeon.  Ask people who smoke to not smoke around you.  Avoid places that make you  want to smoke, such as: ? Bars. ? Parties. ? Smoke-break areas at work.  Spend time with people who do not smoke.  Lower the stress in your life. Stress can make you want to smoke. Try these things to help your stress: ? Getting regular exercise. ? Doing deep-breathing exercises. ? Doing yoga. ? Meditating. ? Doing a body scan. To do this, close your eyes, focus on one area of your body at a time from head to toe. Notice which parts of your body are tense. Try to relax the muscles in those areas. How will I feel when I quit smoking? Day 1 to 3 weeks Within the first 24 hours, you may start to have some problems that come from quitting tobacco. These problems are very bad 2-3 days after you quit, but they do not often last for more than 2-3 weeks. You may get these symptoms:  Mood swings.  Feeling restless, nervous, angry, or annoyed.  Trouble concentrating.  Dizziness.  Strong desire for high-sugar foods and nicotine.  Weight gain.  Trouble pooping (constipation).  Feeling like you may vomit (nausea).  Coughing or a sore throat.  Changes in how the medicines that you take for other issues work in your body.  Depression.  Trouble sleeping (insomnia). Week 3 and afterward After the first 2-3 weeks of quitting, you may start to notice more positive results, such as:  Better sense of smell and taste.  Less coughing and sore throat.  Slower heart rate.  Lower blood pressure.  Clearer skin.  Better breathing.  Fewer sick days. Quitting smoking can be hard. Do not give up if you fail the first time.  Some people need to try a few times before they succeed. Do your best to stick to your quit plan, and talk with your doctor if you have any questions or concerns. Summary  Smoking tobacco is the leading cause of preventable death. Quitting smoking can be hard, but it is one of the best things that you can do for your health.  When you decide to quit smoking, make a plan to help you succeed.  Quit smoking right away, not slowly over a period of time.  When you start quitting, seek help from your doctor, family, or friends. This information is not intended to replace advice given to you by your health care provider. Make sure you discuss any questions you have with your health care provider. Document Revised: 03/12/2019 Document Reviewed: 09/05/2018 Elsevier Patient Education  2020 [employer].

## 2019-07-12 NOTE — Assessment & Plan Note (Addendum)
-   Hx longstanding polysubstance abuse, denies current use  - Seen at [facility] ED in November for accidental drug overdose requiring narcan. Referred to [facility]

## 2019-07-12 NOTE — Assessment & Plan Note (Signed)
-   Active smoker, he is not ready to quit but would like to in the future  - When patient is ready recommend pharmacy consult for smoking cessation

## 2019-07-13 ENCOUNTER — Telehealth: Payer: Self-pay | Admitting: Primary Care

## 2019-07-13 LAB — IGE: IgE (Immunoglobulin E), Serum: 3640 kU/L — ABNORMAL HIGH (ref <=114)

## 2019-07-13 NOTE — Progress Notes (Signed)
Please let patient know IgE and eosinophils are very high- similar to previous. Use inhalers and Singulair as prescribed every day. Needs to keep follow-up, may consider biologic injections (shots) in the future to help control asthma symptoms

## 2019-07-13 NOTE — Telephone Encounter (Signed)
Glenford Bayley, NP  07/13/2019 3:32 PM EST    Please let patient know IgE and eosinophils are very high- similar to previous. Use inhalers and Singulair as prescribed every day. Needs to keep follow-up, may consider biologic injections (shots) in the future to help control asthma symptoms   Called and spoke with pt letting him know the results of the labwork and stated to pt to keep f/u and he verbalized understanding. Nothing further needed.

## 2019-08-12 ENCOUNTER — Other Ambulatory Visit: Payer: Self-pay

## 2019-08-12 ENCOUNTER — Ambulatory Visit (INDEPENDENT_AMBULATORY_CARE_PROVIDER_SITE_OTHER): Payer: No Typology Code available for payment source | Admitting: Family Medicine

## 2019-08-12 ENCOUNTER — Telehealth: Payer: Self-pay | Admitting: Family Medicine

## 2019-08-12 ENCOUNTER — Encounter: Payer: Self-pay | Admitting: Family Medicine

## 2019-08-12 ENCOUNTER — Ambulatory Visit
Admission: RE | Admit: 2019-08-12 | Discharge: 2019-08-12 | Disposition: A | Discharge status: Home / Self Care | Payer: No Typology Code available for payment source | Source: Ambulatory Visit | Attending: Family Medicine | Admitting: Family Medicine

## 2019-08-12 VITALS — BP 114/76 | HR 87 | Temp 98.3°F | Resp 12 | Ht 69.75 in | Wt 152.2 lb

## 2019-08-12 DIAGNOSIS — F1911 Other psychoactive substance abuse, in remission: Secondary | ICD-10-CM | POA: Diagnosis not present

## 2019-08-12 DIAGNOSIS — Z1159 Encounter for screening for other viral diseases: Secondary | ICD-10-CM

## 2019-08-12 DIAGNOSIS — Z7253 High risk bisexual behavior: Secondary | ICD-10-CM | POA: Insufficient documentation

## 2019-08-12 DIAGNOSIS — Q278 Other specified congenital malformations of peripheral vascular system: Secondary | ICD-10-CM | POA: Insufficient documentation

## 2019-08-12 DIAGNOSIS — M25572 Pain in left ankle and joints of left foot: Secondary | ICD-10-CM | POA: Insufficient documentation

## 2019-08-12 DIAGNOSIS — J4550 Severe persistent asthma, uncomplicated: Secondary | ICD-10-CM

## 2019-08-12 DIAGNOSIS — F17210 Nicotine dependence, cigarettes, uncomplicated: Secondary | ICD-10-CM

## 2019-08-12 NOTE — Assessment & Plan Note (Signed)
Left leg nodule - watch for infection. Left ankle continue to monitor - try brace as needed. If redness/fever/chills return.

## 2019-08-12 NOTE — Progress Notes (Signed)
Subjective:     George Becker is a 24 y.o. male presenting for Establish Care (previous PCP Dr Pringle-pediatrics. No one since) and Muscle Pain (pains in left leg and left arm. Blood clot left arm?)     HPI   #Asthma - not doing great with taking the advair every day - lost it for a few days - is planning to start - has been using rescue inhaler daily  #Hx of substance use - seeing Dr. Lafayette Dragon in [facility] - psychiatrist  - has been on suboxone for a few months - working with out of network - last use 12/22 - hx of IV Drug abuse - suboxone taking care of cravings and withdrawal symptoms - last HIV 2-3 months ago - last tested for Hep C a while ago - endorses sharing needles with someone who was Hep C +  - not interested in HIV testing today  #severe eczema - looking into starting immune medication  #Left arm/leg pain - had blood drawn at the pulmonologist's office - when they drew his blood - his vein felt rope like - since then his vein has felt hard - and this has not gone away - is having muscle pains in his legs now - will feel like a knot in his leg which will occur after a nap and get better with movement - concerned about possible clot - hx of cellulitis in right leg - did have some red spots on the left leg - that went away   Review of Systems   Social History   Tobacco Use  Smoking Status Current Every Day Smoker  . Packs/day: 1.00  . Years: 5.00  . Pack years: 5.00  . Types: Cigarettes  Smokeless Tobacco Never Used        Objective:    BP Readings from Last 3 Encounters:  08/12/19 114/76  07/12/19 112/62  03/26/18 118/80   Wt Readings from Last 3 Encounters:  08/12/19 152 lb 4 oz (69.1 kg)  07/12/19 152 lb 12.8 oz (69.3 kg)  03/26/18 155 lb (70.3 kg)    BP 114/76   Pulse 87   Temp 98.3 F (36.8 C)   Resp 12   Ht 5' 9.75" (1.772 m)   Wt 152 lb 4 oz (69.1 kg)   BMI 22.00 kg/m    Physical Exam Constitutional:    Appearance: Normal appearance. He is not ill-appearing or diaphoretic.  HENT:     Right Ear: External ear normal.     Left Ear: External ear normal.     Nose: Nose normal.  Eyes:     General: No scleral icterus.    Extraocular Movements: Extraocular movements intact.     Conjunctiva/sclera: Conjunctivae normal.  Cardiovascular:     Rate and Rhythm: Normal rate.  Pulmonary:     Effort: Pulmonary effort is normal.  Musculoskeletal:     Cervical back: Neck supple.     Comments: Left foot:  Inspection: no skin changes Palpation: posterior ankle with muscular TTP ROM: Normal Strength: normal but pain with resisted plantar flexion Ligments: normal  Skin:    General: Skin is warm and dry.     Comments: Nodule palpated on the left subcubital area where the vein is present. Left LE with scattered erythematous lesions with scale. One soft nodule on the shin w/o erythema but TTP non-fluctuant.   Neurological:     Mental Status: He is alert. Mental status is at baseline.  Psychiatric:  Mood and Affect: Mood normal.           Assessment & Plan:   Problem List Items Addressed This Visit      Cardiovascular and Mediastinum   Abnormality of systemic vein    Does have nodule following blood draw. No swelling in the arm but will [place] to rule-out clot      Relevant Orders   US Venous Img Upper Uni Left (DVT)     Respiratory   Asthma    Has had ER visits for asthma. Follows with pulmonology. Not adherent to advair. Encouraged and emphasized importance of using regularly.         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 Hx of IVDU last in December. Is interested in Hep C testing today. Does not want HIV testing today, recent negative a few months ago. No new sexual partners. Sees Dr. 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 Uses condoms most of the  time. Is interested in Pre-exposure prophylaxis for HIV prevention due to prior drug abuse was never able to be adherent to follow-up. Advised him to schedule f/u visit to discuss if he would like to start treatment.        Other Visit Diagnoses    Need for hepatitis C screening test       Relevant Orders   Hepatitis C antibody       Return if symptoms worsen or fail to improve.  Lesleigh Noe, MD

## 2019-08-12 NOTE — Patient Instructions (Addendum)
Great to meet you today!  1) Ultrasound of the left arm to make sure there is not a clot  2) Let me know your left leg starts to have redness or if the nodules increase in size  3) Keep up the good work with Suboxone!   4) Start taking your Advair twice daily

## 2019-08-12 NOTE — Assessment & Plan Note (Signed)
No recent partners since last STI testing. Uses condoms most of the time. Is interested in Pre-exposure prophylaxis for HIV prevention due to prior drug abuse was never able to be adherent to follow-up. Advised him to schedule f/u visit to discuss if he would like to start treatment.

## 2019-08-12 NOTE — Telephone Encounter (Signed)
Call report from [facility]  -  No DVT evident.  IMPRESSION: 1. No evidence of DVT within the left upper extremity. 2. Examination is positive for occlusive short-segment superficial thrombophlebitis involving a superficial branch of the left basilic vein, correlating with the patient's palpable area of concern. Again, there is no extension of this short-segment occlusive SVT to the more proximal aspect of the basilic vein or to the deep venous system of the left upper extremity.

## 2019-08-12 NOTE — Telephone Encounter (Signed)
Sent information via MyChart to patient.   Left voicemail advising him to check MyChart

## 2019-08-12 NOTE — Assessment & Plan Note (Signed)
Doing well on suboxone. Hx of IVDU last in December. Is interested in Hep C testing today. Does not want HIV testing today, recent negative a few months ago. No new sexual partners. Sees Dr. Evelene Croon

## 2019-08-12 NOTE — Assessment & Plan Note (Signed)
Has had ER visits for asthma. Follows with pulmonology. Not adherent to advair. Encouraged and emphasized importance of using regularly.

## 2019-08-12 NOTE — Assessment & Plan Note (Signed)
Does have nodule following blood draw. No swelling in the arm but will [place] to rule-out clot

## 2019-08-14 ENCOUNTER — Other Ambulatory Visit: Payer: Self-pay

## 2019-08-14 ENCOUNTER — Encounter: Payer: Self-pay | Admitting: Emergency Medicine

## 2019-08-14 DIAGNOSIS — Z79899 Other long term (current) drug therapy: Secondary | ICD-10-CM | POA: Diagnosis not present

## 2019-08-14 DIAGNOSIS — J45909 Unspecified asthma, uncomplicated: Secondary | ICD-10-CM | POA: Insufficient documentation

## 2019-08-14 DIAGNOSIS — M79622 Pain in left upper arm: Secondary | ICD-10-CM | POA: Diagnosis present

## 2019-08-14 DIAGNOSIS — I8002 Phlebitis and thrombophlebitis of superficial vessels of left lower extremity: Secondary | ICD-10-CM | POA: Insufficient documentation

## 2019-08-14 DIAGNOSIS — F17210 Nicotine dependence, cigarettes, uncomplicated: Secondary | ICD-10-CM | POA: Diagnosis not present

## 2019-08-14 LAB — COMPREHENSIVE METABOLIC PANEL
ALT: 74 U/L — ABNORMAL HIGH (ref 0–44)
AST: 34 U/L (ref 15–41)
Albumin: 4.1 g/dL (ref 3.5–5.0)
Alkaline Phosphatase: 55 U/L (ref 38–126)
Anion gap: 8 (ref 5–15)
BUN: 19 mg/dL (ref 6–20)
CO2: 31 mmol/L (ref 22–32)
Calcium: 9.2 mg/dL (ref 8.9–10.3)
Chloride: 100 mmol/L (ref 98–111)
Creatinine, Ser: 0.55 mg/dL — ABNORMAL LOW (ref 0.61–1.24)
GFR calc Af Amer: >60 mL/min (ref >60)
GFR calc non Af Amer: >60 mL/min (ref >60)
Glucose, Bld: 114 mg/dL — ABNORMAL HIGH (ref 70–99)
Potassium: 3.8 mmol/L (ref 3.5–5.1)
Sodium: 139 mmol/L (ref 135–145)
Total Bilirubin: 0.9 mg/dL (ref 0.3–1.2)
Total Protein: 6.9 g/dL (ref 6.5–8.1)

## 2019-08-14 LAB — CBC
HCT: 42.1 % (ref 39.0–52.0)
Hemoglobin: 14.7 g/dL (ref 13.0–17.0)
MCH: 29.8 pg (ref 26.0–34.0)
MCHC: 34.9 g/dL (ref 30.0–36.0)
MCV: 85.4 fL (ref 80.0–100.0)
Platelets: 211 10*3/uL (ref 150–400)
RBC: 4.93 MIL/uL (ref 4.22–5.81)
RDW: 13.2 % (ref 11.5–15.5)
WBC: 6.4 10*3/uL (ref 4.0–10.5)
nRBC: 0 % (ref 0.0–0.2)

## 2019-08-14 NOTE — ED Notes (Signed)
Pt sitting in lobby on phone in no acute distress.

## 2019-08-14 NOTE — ED Triage Notes (Signed)
Patient states that he was an IV drug user and quit using about 2 months ago. Patient states that two days ago he was diagnosed with a superficial thrombus to his left AC. Patient states that he now has an area of swelling to his left wrist. Patient states that he is concerned that the thrombus has moved. Patient also states that he also has redness to bilateral acs and that he is concerned for infection.

## 2019-08-15 ENCOUNTER — Emergency Department
Admission: EM | Admit: 2019-08-15 | Discharge: 2019-08-15 | Disposition: A | Discharge status: Home / Self Care | Payer: No Typology Code available for payment source | Attending: Emergency Medicine | Admitting: Emergency Medicine

## 2019-08-15 DIAGNOSIS — I808 Phlebitis and thrombophlebitis of other sites: Secondary | ICD-10-CM

## 2019-08-15 MED ORDER — DOXYCYCLINE HYCLATE 100 MG PO TABS: 100.0000 mg | ORAL_TABLET | Freq: Two times a day (BID) | ORAL | 0 refills | Status: DC

## 2019-08-15 MED ORDER — ASPIRIN EC 81 MG PO TBEC
81.0000 mg | DELAYED_RELEASE_TABLET | Freq: Once | ORAL | Status: AC
  Administered 2019-08-15: 01:00:00 81 mg via ORAL
  Filled 2019-08-15: qty 1

## 2019-08-15 MED ORDER — DOXYCYCLINE HYCLATE 100 MG PO TABS
100.0000 mg | ORAL_TABLET | Freq: Once | ORAL | Status: AC
  Administered 2019-08-15: 100 mg via ORAL
  Filled 2019-08-15: qty 1

## 2019-08-15 NOTE — ED Provider Notes (Signed)
[facility] Emergency Department Provider Note  Time seen: 12:30 AM  I have reviewed the triage vital signs and the nursing notes.   HISTORY  Chief Complaint Arm Pain   HPI George Becker is a 24 y.o. 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  According to the patient he was seen here 2 days ago for the same was diagnosed with a left superficial thrombophlebitis.  Patient states this evening he noticed some swelling around his hand, he was concerned that the clot could have moved so he came to the emergency department for evaluation.  Patient denies any fever, no chest pain or shortness of breath.   Past Medical History:  Diagnosis Date  . Anxiety   . Asthma   . Substance abuse [facility])     Patient Active Problem List   Diagnosis Date Noted  . Abnormality of systemic vein 08/12/2019  . 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 Acute left ankle pain 08/12/2019  . Depression with anxiety 05/27/2017  . Palpitations 05/21/2017  . Continuous dependence on cigarette smoking 05/21/2017  . Substance induced mood disorder (HCC) 03/16/2017  . Asthma 01/17/2016  . Substance abuse in remission (HCC) 10/04/2015    Past Surgical History:  Procedure Laterality Date  . NO PAST SURGERIES      Prior to Admission medications   Medication Sig Start Date End Date Taking? Authorizing Provider  albuterol (PROVENTIL) (2.5 MG/3ML) 0.083% nebulizer solution Take 3 mLs (2.5 mg total) by nebulization every 8 (eight) hours as needed for wheezing or shortness of breath. 07/12/19   Glenford Bayley, NP  albuterol (VENTOLIN HFA) 108 (90 Base) MCG/ACT inhaler Inhale 2 puffs into the lungs every 6 (six) hours as needed for wheezing or shortness of breath. 07/12/19   Glenford Bayley, NP  Buprenorphine HCl-Naloxone HCl (SUBOXONE SL) Place under the tongue.     [provider]  esomeprazole (NEXIUM) 20 MG packet Take 20 mg by mouth daily as needed.     [provider]  Fluticasone-Salmeterol (ADVAIR) 500-50 MCG/DOSE AEPB Inhale 1 puff into the lungs 2 (two) times daily. 07/12/19   Glenford Bayley, NP  ibuprofen (ADVIL,MOTRIN) 200 MG tablet Take 600 mg by mouth every 6 (six) hours as needed for moderate pain.     [provider]  montelukast (SINGULAIR) 10 MG tablet Take 1 tablet (10 mg total) by mouth daily as needed. Patient taking differently: Take 10 mg by mouth daily.  07/12/19 08/12/19  Glenford Bayley, NP  naloxone [facility]) 0.4 MG/ML injection  05/28/19   [provider]  triamcinolone ointment (KENALOG) 0.1 % APPLY TO AFFECTED AREAS ON DAILY UNTIL CLEAR, THEN NEEDED *MAX PER INSURANCE* 07/12/19   [provider]  valACYclovir (VALTREX) 1000 MG tablet TAKE 1 TABLET (1,000 MG) BY MOUTH DAILY 03/15/18   [provider]    Allergies  Allergen Reactions  . Amoxicillin Anaphylaxis and Hives  . Augmentin [Amoxicillin-Pot Clavulanate] Anaphylaxis and Hives  . Other Anaphylaxis    "all Nuts"  . Peanut Butter Flavor   . Penicillins Anaphylaxis    Family History  Problem Relation Age of Onset  . Diabetes Mother   . Sleep apnea Mother   . Hypertension Father   . Asthma Maternal Grandfather   . Congestive Heart Failure Maternal Grandfather   . Other Maternal Grandfather  hypersensitivity pneumonitis  . Diabetes Maternal Grandfather   . Hypertension Maternal Grandmother   . Lymphoma Maternal Grandmother   . Diabetes Paternal Grandmother   . Hyperlipidemia Paternal Grandfather   . Hypertension Paternal Grandfather     Social History Social History   Tobacco Use  . Smoking status: Current Every Day Smoker    Packs/day: 1.00    Years: 5.00    Pack years: 5.00    Types: Cigarettes  . Smokeless tobacco: Never Used  Substance Use Topics  . Alcohol use: Not Currently     Alcohol/week: 0.0 standard drinks  . Drug use: Not Currently    Types: Heroin, Methamphetamines    Comment: IV drugs    Review of Systems Constitutional: Negative for fever. Cardiovascular: Negative for chest pain. Respiratory: Negative for shortness of breath. Gastrointestinal: Negative for abdominal pain Musculoskeletal: Mild swelling of the left arm although largely resolved per patient. Neurological: Negative for headache All other ROS negative  ____________________________________________   PHYSICAL EXAM:  VITAL SIGNS: ED Triage Vitals [08/14/19 2148]  Enc Vitals Group     BP (!) 127/98     Pulse Rate 92     Resp 18     Temp 98.6 F (37 C)     Temp Source Oral     SpO2 100 %     Weight 150 lb (68 kg)     Height 5\' 10"  (1.778 m)     Head Circumference      Peak Flow      Pain Score 0     Pain Loc      Pain Edu?      Excl. in GC?     Constitutional: Alert and oriented. Well appearing and in no distress. Eyes: Normal exam ENT      Head: Normocephalic and atraumatic.      Mouth/Throat: Mucous membranes are moist. Cardiovascular: Normal rate, regular rhythm.  Respiratory: Normal respiratory effort without tachypnea nor retractions. Breath sounds are clear  Gastrointestinal: Soft and nontender. No distention.  Musculoskeletal: No appreciable edema at this time.  Possible mild palpable cord in left AC, no tenderness.  Normal right AC.  Neurovascular intact distally bilaterally. Neurologic:  Normal speech and language. No gross focal neurologic deficits  Skin:  Skin is warm, dry and intact.  Psychiatric: Mood and affect are normal.  ____________________________________________   INITIAL IMPRESSION / ASSESSMENT AND PLAN / ED COURSE  Pertinent labs & imaging results that were available during my care of the patient were reviewed by me and considered in my medical decision making (see chart for details).   Patient presents to the emergency department for  evaluation of left arm.  Patient states he was diagnosed with superficial thrombophlebitis several days ago, has noted a slight increase in swelling of the left hand tonight and was concerned so came to the emergency department.  Patient states he was laying on his left arm watching a movie when he noticed the swelling, states now that he is been here for a couple hours the swelling is gone.  No fever, no shortness of breath or chest pain.  No tenderness to the arm.  Arm is neurovascular intact bilaterally.  Given the superficial thrombophlebitis we will cover with antibiotics as a precaution I also recommended a 1 mg aspirin each morning for the next 4 weeks and warm compresses for 20 to 30 minutes every several hours during the daytime.  Patient agreeable to plan of care I discussed return precautions  as well.  George Becker was evaluated in Emergency Department on 08/15/2019 for the symptoms described in the history of present illness. He was evaluated in the context of the global COVID-19 pandemic, which necessitated consideration that the patient might be at risk for infection with the SARS-CoV-2 virus that causes COVID-19. Institutional protocols and algorithms that pertain to the evaluation of patients at risk for COVID-19 are in a state of rapid change based on information released by regulatory bodies including the CDC and federal and state organizations. These policies and algorithms were followed during the patient's care in the ED.  ____________________________________________   FINAL CLINICAL IMPRESSION(S) / ED DIAGNOSES  Superficial thrombophlebitis   Harvest Dark, MD 08/15/19 [phone]

## 2019-08-15 NOTE — Discharge Instructions (Addendum)
As we discussed please begin taking 1 enteric-coated 81 mg aspirin each morning for the next 4 weeks.  Please use warm compresses to your left arm 20 to 30 minutes every several hours during the daytime.  Please keep the arm elevated when resting.  Please take your prescribed course of antibiotics.  Return to the emergency department for any increased pain swelling or redness of the left arm chest pain or shortness of breath.

## 2019-08-16 ENCOUNTER — Encounter: Payer: Self-pay | Admitting: Family Medicine

## 2019-08-16 LAB — HEPATITIS C ANTIBODY
Hepatitis C Ab: REACTIVE — AB
SIGNAL TO CUT-OFF: 8.89 — ABNORMAL HIGH (ref <1.00)

## 2019-08-16 LAB — HCV RNA,QUANTITATIVE REAL TIME PCR
HCV Quantitative Log: 1.75 Log IU/mL — ABNORMAL HIGH
HCV RNA, PCR, QN: 56 IU/mL — ABNORMAL HIGH

## 2019-08-16 NOTE — Telephone Encounter (Signed)
Called his mom to see if he was available. She was not at home, but would be around him later today.   Will call back in a few hours.

## 2019-08-16 NOTE — Telephone Encounter (Signed)
Called patient but his number was not working. Spoke to patient's mom-George Becker, ok per DPR on file. Patient does not have an active phone right now. Gave the [place] report to mom. Patient also went to ER yesterday-notes in epic. He was placed on Doxy and was told to take Aspirin 81 mg 2 daily. Patient's mom wants to know Dr Elmyra Ricks opinion on the Aspirin.

## 2019-08-16 NOTE — Telephone Encounter (Signed)
Attempted to call mom to reach George Becker.   Generally for superficial clots Aspirin is not necessary.   The harm or risk - would be stomach bleeding.  It is possible aspirin will help the clot dissolve faster, but its use is primarily a just in case.

## 2019-08-17 ENCOUNTER — Telehealth: Payer: Self-pay | Admitting: Family Medicine

## 2019-08-17 DIAGNOSIS — B1710 Acute hepatitis C without hepatic coma: Secondary | ICD-10-CM

## 2019-08-17 NOTE — Telephone Encounter (Signed)
Called patient. See other encounter.

## 2019-08-17 NOTE — Telephone Encounter (Signed)
Discussed Hepatitis C positive status.   Patient is interested in treatment. Will place referral  Also reviewed ER visit - no additional questions from patient.   Reports phone calls are better than MyChart and that detailed voicemail OK. Updated contacts with this information

## 2019-08-17 NOTE — Telephone Encounter (Signed)
Patient's mother advised-ok per DPR on file. Patient does have his number active now please call him back with his lab results. Mom thought Dr Selena Batten wanted to speak with patient about his lab results.

## 2019-10-13 ENCOUNTER — Telehealth: Payer: Self-pay | Admitting: Primary Care

## 2019-10-13 MED ORDER — FLUTICASONE-SALMETEROL 500-50 MCG/DOSE IN AEPB: 1.0000 | INHALATION_SPRAY | Freq: Two times a day (BID) | RESPIRATORY_TRACT | 1 refills | Status: DC

## 2019-10-13 NOTE — Telephone Encounter (Signed)
I called pt but he did not answer. I left message on his VM advising him that I sent in a 90 day supply of Advair. Nothing further is needed.

## 2019-12-08 ENCOUNTER — Other Ambulatory Visit: Payer: Self-pay

## 2019-12-08 ENCOUNTER — Encounter: Payer: Self-pay | Admitting: Infectious Diseases

## 2019-12-08 ENCOUNTER — Telehealth: Payer: Self-pay | Admitting: Pharmacy Technician

## 2019-12-08 ENCOUNTER — Ambulatory Visit (INDEPENDENT_AMBULATORY_CARE_PROVIDER_SITE_OTHER): Payer: No Typology Code available for payment source | Admitting: Infectious Diseases

## 2019-12-08 VITALS — BP 120/72 | HR 79 | Temp 98.0°F | Ht 71.0 in | Wt 158.0 lb

## 2019-12-08 DIAGNOSIS — Z7253 High risk bisexual behavior: Secondary | ICD-10-CM

## 2019-12-08 DIAGNOSIS — Z114 Encounter for screening for human immunodeficiency virus [HIV]: Secondary | ICD-10-CM | POA: Diagnosis not present

## 2019-12-08 DIAGNOSIS — F1911 Other psychoactive substance abuse, in remission: Secondary | ICD-10-CM | POA: Diagnosis not present

## 2019-12-08 DIAGNOSIS — R768 Other specified abnormal immunological findings in serum: Secondary | ICD-10-CM

## 2019-12-08 DIAGNOSIS — B182 Chronic viral hepatitis C: Secondary | ICD-10-CM | POA: Diagnosis not present

## 2019-12-08 NOTE — Assessment & Plan Note (Signed)
Will screen for HIV today with RNA. If negative will get him established in PrEP clinic here to start Descovy and of course offer treatment for HIV if indicated. Encouraged to abstain or use strict condoms for now until lab results return.

## 2019-12-08 NOTE — Assessment & Plan Note (Signed)
Previously he was found to be Hepatitis Ab (-) in 2018 Labs in February of 2021 indicate positive Ab with 8.89 titer - RNA was found to be very low level at 59 copies. He had mildly elevated AST at this time.  Last injection use was June 22, 2019. It is very possible that he is clearing this infection naturally. Will repeat quantitative RNA today along with HIV, Hep A/B testing and Fibrotest to stage liver. It would be extremely unlikely he has long term or advanced damage from Hep C given he was found to be negative 3 years ago.   Follow up pending results on blood work.

## 2019-12-08 NOTE — Assessment & Plan Note (Signed)
H/O heroin and methamphetamine injection drug use.  Maintained on Suboxone currently but he wishes to wean off of this eventually. He has had many attempts at rehab in the past year but he is doing very well recently. Congratulated on sobriety efforts.

## 2019-12-08 NOTE — Telephone Encounter (Signed)
[facility] Patient Advocate Encounter    Findings of the benefits investigation:   Insurance: [employer] (active commercial)  Prior Authorization: will begin insurance process once medication is prescribed to ease the [facility] process.  Insurance dictates the patient must [facility] [place] and fill brand name Dorita Fray.  [facility] Patient Advocate will follow up once patient arrives for their appointment to let him know the pharmacy info.  Beulah Gandy, CPhT Specialty Pharmacy Patient [facility] for Infect[facility] Phone: [phone] Fax: [phone] 12/08/2019 1:47 PM

## 2019-12-08 NOTE — Patient Instructions (Signed)
Nice to meet you today!    We need to get a little more information about your hepatitis c infection before we start your treatment.  There is a chance your immune system is clearing it on its own - will let you know about your blood work results.   Will also get you into our PrEP clinic if we can.     ABOUT HEPATITIS C VIRUS:   Chronic Hepatitis C is the most common blood-borne infection in the [place], affecting approximately 3 million people.   It is the leading cause of cirrhosis, liver cancer, and end stage liver disease requiring transplantation when this infection goes untreated for many years   The majority of people who are infected are unaware because there are not many early symptoms that are specific to this and often go undiagnosed until a specific blood test is drawn.    The hepatitis c virus is passed primarily through direct exposure of contaminated blood or body fluids. It is most efficiently transmitted through repeated exposure to infected blood.   Risk for sexual transmission is very low but is possible if there is high frequency of unprotected sexual activity with known hepatitis c partner or multiple partners of known status.   Over time, approximately 60-70% of people can develop some degree of liver disease. Cirrhosis occurs in 10-20% of those with chronic infection. 1-5% will get liver cancer, which has a very high rate of death.    Approximately 15-25% clear the infection without medication (usually in the first 6 months of becoming exposed to virus)   Newer medications provide over 95% cure rate when taken as prescribed    IN GENERAL ABOUT DIET  . Persons living with chronic hepatitis c infection should eat a diet to maintain a healthy weight and avoid nutritional deficiencies.   . Completely avoiding alcohol is the best decision for your liver health. If unable to do so please limit alcohol to as little as possible to less than 1 standard drink  a day - this is very irritating to your liver.  . Limit tylenol use to less than 2,000 mg daily (two extra strength tablets only twice a day)  . If you have cirrhosis of the liver please take no more than 1,000 mg tylenol a day  . Patients with cirrhosis should not have protein restriction; we recommend a protein intake of approximately 1.2-1.5 g/kg/day.   . For patients with cirrhosis and hepatic encephalopathy, the American Association for the Study of Liver Diseases (AASLD) recommended protein intake is 1.2-1.5 g/kg/day.  . If you experience ascites (fluid accumulation in the abdomen associated with severe liver damage / cirrhosis) please limit sodium intake to < 2000 mg a day    UNTIL YOU HAVE BEEN TREATED AND CURED:  . Use condoms with all sexual encounters or practice abstinence to avoid sexual transmission   . No sharing of razors, toothbrushes, nail clippers or anything that could potentially have blood on it.   . If you cut yourself please clean and cover any wounds or open sores to others do not come into contact with your blood.   . If blood spills onto item/surface please clean with 1:10 bleach solution and allow to dry, EVEN if it is dried blood.    GENERAL HELPFUL HINTS ON HCV THERAPY:  1. Stay well-hydrated.  2. Notify the [facility] Clinic of any changes in your other over-the-counter/herbal or prescription medications.  3. If you miss a dose of your  medication, take the missed dose as soon as you remember. Return to your regular time/dose schedule the next day.   4.  Do not stop taking your medications without first talking with your healthcare provider.  5.  You will see our pharmacist-specialist within the first 2 weeks of starting your medication to monitor for any possible side effects.  6.  You will have blood work once during treatment 4 weeks after your first pill. Again soon after treatment is completed and one final lab 3 months after your last pill to ensure  cure!   TIPS TO BE SUCCESSFUL WITH DAILY MEDICATION USE:  1. Set a reminder on your phone  2. Try filling out a pill box for the week - pick a day and put one pill for every day during the week so you know right away if you missed a pill.   3. Have a trusted family member ask you about your medications.   4. Smartphone app

## 2019-12-08 NOTE — Progress Notes (Signed)
Patient: George Becker  DOB: [birth year] MRN: [id number] PCP: Lesleigh Noe, MD    Patient Active Problem List   Diagnosis Date Noted  . Positive hepatitis C antibody test 12/08/2019  . Abnormality of systemic vein 08/12/2019  . 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 Depression with anxiety 05/27/2017  . Continuous dependence on cigarette smoking 05/21/2017  . Substance induced mood disorder ([place]) 03/16/2017  . Asthma 01/17/2016  . Substance abuse in remission ([place]) 10/04/2015     Subjective:  George Becker is a 24 y.o. male here for evaluation of positive hepatitis c antibody and positive RNA in 08-2019.   George Becker is here with his mother today and has given me permission to discuss openly his health today as it pertains to the visit.   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 Over the last year he has attempted several times rehab programs and currently maintained on Suboxone for opioid replacement and is abstaining from meth. He "feels pretty crappy" at baseline, of which he attributes to Suboxone side effect. He is hopeful he can wean off of this soon and not be dependent on anything. His last injection drug use was Jun 22, 2019. He felt like he had hepatitis C after learning about it in [place] and requested testing with PCP.   He has had no changes to bowel movements, urine color, skin or eye color. He did have some abdominal pain but attributed more to opioid withdrawal. He has eczema and is being considered for injectable therapy for this given his rashes are so severe.   He is also concerned for HIV infection; he is MSM/bisexual and has had multiple sexual partners in the past. He does think he received a negative screen for HIV in the last 1-2 years but cannot recall exactly when. He does no recall specfically any illness c/w acute retroviral syndrome symptoms.   His mother has been helping take care of him - she has had some  contact with his blood through laundering his clothing with blood spots on them.  She has not been tested for hepatitis C but thinks she will just to be safe given handling of bloody clothes.     Review of Systems  Constitutional: Positive for malaise/fatigue. Negative for chills and fever.  HENT: Negative for tinnitus.   Eyes: Negative for blurred vision and photophobia.  Respiratory: Negative for cough and sputum production.   Cardiovascular: Negative for chest pain.  Gastrointestinal: Negative for abdominal pain, diarrhea, nausea and vomiting.  Genitourinary: Negative for dysuria.  Musculoskeletal: Negative for joint pain and myalgias.  Skin: Positive for rash (eczema ).  Neurological: Negative for headaches.  Psychiatric/Behavioral: The patient is nervous/anxious.     Past Medical History:  Diagnosis Date  . Anxiety   . Asthma   . Substance abuse [facility])     Outpatient Medications Prior to Visit  Medication Sig Dispense Refill  . albuterol (PROVENTIL) (2.5 MG/3ML) 0.083% nebulizer solution Take 3 mLs (2.5 mg total) by nebulization every 8 (eight) hours as needed for wheezing or shortness of breath. 360 mL 5  . albuterol (VENTOLIN HFA) 108 (90 Base) MCG/ACT inhaler Inhale 2 puffs into the lungs every 6 (six) hours as needed for wheezing or shortness of breath. 8 g 3  . Buprenorphine HCl-Naloxone HCl (SUBOXONE SL) Place under the tongue.    Marland Kitchen esomeprazole (NEXIUM) 20 MG packet Take 20 mg by mouth daily as needed.     Marland Kitchen  Fluticasone-Salmeterol (ADVAIR) 500-50 MCG/DOSE AEPB Inhale 1 puff into the lungs 2 (two) times daily. 180 each 1  . ibuprofen (ADVIL,MOTRIN) 200 MG tablet Take 600 mg by mouth every 6 (six) hours as needed for moderate pain.     . naloxone (NARCAN) 0.4 MG/ML injection     . triamcinolone ointment (KENALOG) 0.1 % APPLY TO AFFECTED AREAS ON DAILY UNTIL CLEAR, THEN NEEDED *MAX PER INSURANCE*    . valACYclovir (VALTREX) 1000 MG tablet TAKE 1 TABLET (1,000 MG) BY MOUTH  DAILY  99  . doxycycline (VIBRA-TABS) 100 MG tablet Take 1 tablet (100 mg total) by mouth 2 (two) times daily. (Patient not taking: Reported on 12/08/2019) 20 tablet 0  . montelukast (SINGULAIR) 10 MG tablet Take 1 tablet (10 mg total) by mouth daily as needed. (Patient taking differently: Take 10 mg by mouth daily. ) 90 tablet 3   No facility-administered medications prior to visit.     Allergies  Allergen Reactions  . Amoxicillin Anaphylaxis and Hives  . Augmentin [Amoxicillin-Pot Clavulanate] Anaphylaxis and Hives  . Other Anaphylaxis    "all Nuts"  . Peanut Butter Flavor   . Penicillins Anaphylaxis    Social History   Tobacco Use  . Smoking status: Current Every Day Smoker    Packs/day: 1.00    Years: 5.00    Pack years: 5.00    Types: Cigarettes  . Smokeless tobacco: Never Used  Substance Use Topics  . Alcohol use: Not Currently    Alcohol/week: 0.0 standard drinks  . Drug use: Not Currently    Types: Heroin, Methamphetamines    Comment: IV drugs    Family History  Problem Relation Age of Onset  . Diabetes Mother   . Sleep apnea Mother   . Hypertension Father   . Asthma Maternal Grandfather   . Congestive Heart Failure Maternal Grandfather   . Other Maternal Grandfather        hypersensitivity pneumonitis  . Diabetes Maternal Grandfather   . Hypertension Maternal Grandmother   . Lymphoma Maternal Grandmother   . Diabetes Paternal Grandmother   . Hyperlipidemia Paternal Grandfather   . Hypertension Paternal Grandfather     Objective:   Vitals:   12/08/19 1402  BP: 120/72  Pulse: 79  Temp: 98 F (36.7 C)  Weight: 158 lb (71.7 kg)  Height: 5\' 11"  (1.803 m)   Body mass index is 22.04 kg/m.  Physical Exam Constitutional:      Appearance: Normal appearance. He is not ill-appearing.  Eyes:     General: No scleral icterus.    Pupils: Pupils are equal, round, and reactive to light.  Cardiovascular:     Rate and Rhythm: Normal rate.  Pulmonary:      Effort: Pulmonary effort is normal.  Abdominal:     General: Bowel sounds are normal. There is no distension.     Palpations: Abdomen is soft. There is no fluid wave, hepatomegaly or splenomegaly.     Tenderness: There is no abdominal tenderness.  Musculoskeletal:        General: No swelling.     Cervical back: Normal range of motion.     Right lower leg: No edema.     Left lower leg: No edema.  Skin:    Capillary Refill: Capillary refill takes less than 2 seconds.     Coloration: Skin is not jaundiced.     Findings: No rash.  Neurological:     Mental Status: He is alert and oriented to person,  place, and time.  Psychiatric:        Behavior: Behavior normal.        Judgment: Judgment normal.     Lab Results: Lab Results  Component Value Date   WBC 6.4 08/14/2019   HGB 14.7 08/14/2019   HCT 42.1 08/14/2019   MCV 85.4 08/14/2019   PLT 211 08/14/2019    Lab Results  Component Value Date   CREATININE 0.55 (L) 08/14/2019   BUN 19 08/14/2019   NA 139 08/14/2019   K 3.8 08/14/2019   CL 100 08/14/2019   CO2 31 08/14/2019    Lab Results  Component Value Date   ALT 74 (H) 08/14/2019   AST 34 08/14/2019   ALKPHOS 55 08/14/2019   BILITOT 0.9 08/14/2019     Assessment & Plan:   Problem List Items Addressed This Visit      Unprioritized   Positive hepatitis C antibody test (Chronic)    Previously he was found to be Hepatitis Ab (-) in 2018 Labs in February of 2021 indicate positive Ab with 8.89 titer - RNA was found to be very low level at 59 copies. He had mildly elevated AST at this time.  Last injection use was June 22, 2019. It is very possible that he is clearing this infection naturally. Will repeat quantitative RNA today along with HIV, Hep A/B testing and Fibrotest to stage liver. It would be extremely unlikely he has long term or advanced damage from Hep C given he was found to be negative 3 years ago.   Follow up pending results on blood work.         Substance abuse in remission [facility])    H/O heroin and methamphetamine injection drug use.  Maintained on Suboxone currently but he wishes to wean off of this eventually. He has had many attempts at rehab in the past year but he is doing very well recently. Congratulated on sobriety efforts.       High risk bisexual behavior    Will screen for HIV today with RNA. If negative will get him established in PrEP clinic here to start Descovy and of course offer treatment for HIV if indicated. Encouraged to abstain or use strict condoms for now until lab results return.        Other Visit Diagnoses    Encounter for screening for HIV    -  Primary   Relevant Orders   HIV-1 RNA quant-no reflex-bld   HIV Antibody (routine testing w rflx)   Chronic hepatitis C without hepatic coma (HCC)       Relevant Orders   Liver Fibrosis, FibroTest-ActiTest   Hepatitis C RNA quantitative ([facility])   Hepatitis B surface antigen   Hepatitis B surface antibody,qualitative   Hepatitis A Ab, Total   Hepatitis B Core Antibody, total      Rexene Alberts, MSN, NP-C [facility] for Infectious Disease [facility] Pager: [phone] Office: [phone]  12/08/19  8:57 PM

## 2019-12-22 LAB — HIV-1 RNA QUANT-NO REFLEX-BLD
HIV 1 RNA Quant: <20 copies/mL
HIV-1 RNA Quant, Log: <1.3 Log copies/mL

## 2019-12-22 LAB — HEPATITIS B SURFACE ANTIGEN: Hepatitis B Surface Ag: NONREACTIVE

## 2019-12-22 LAB — LIVER FIBROSIS, FIBROTEST-ACTITEST
ALT: 58 U/L — ABNORMAL HIGH (ref 9–46)
Alpha-2-Macroglobulin: 182 mg/dL (ref 106–279)
Apolipoprotein A1: 152 mg/dL (ref 94–176)
Bilirubin: 0.5 mg/dL (ref 0.2–1.2)
Fibrosis Score: 0.12
GGT: 12 U/L (ref 3–70)
Haptoglobin: 40 mg/dL — ABNORMAL LOW (ref 43–212)
Necroinflammat ACT Score: 0.28
Reference ID: 3440520

## 2019-12-22 LAB — HEPATITIS B SURFACE ANTIBODY,QUALITATIVE: Hep B S Ab: NONREACTIVE

## 2019-12-22 LAB — HIV ANTIBODY (ROUTINE TESTING W REFLEX): HIV 1&2 Ab, 4th Generation: NONREACTIVE

## 2019-12-22 LAB — HEPATITIS C RNA QUANTITATIVE
HCV Quantitative Log: 2.77 Log IU/mL — ABNORMAL HIGH
HCV RNA, PCR, QN: 595 IU/mL — ABNORMAL HIGH

## 2019-12-22 LAB — HEPATITIS A ANTIBODY, TOTAL: Hepatitis A AB,Total: REACTIVE — AB

## 2019-12-22 LAB — HEPATITIS B CORE ANTIBODY, TOTAL: Hep B Core Total Ab: NONREACTIVE

## 2019-12-29 ENCOUNTER — Ambulatory Visit (INDEPENDENT_AMBULATORY_CARE_PROVIDER_SITE_OTHER): Payer: No Typology Code available for payment source | Admitting: Pharmacist

## 2019-12-29 ENCOUNTER — Other Ambulatory Visit: Payer: Self-pay

## 2019-12-29 ENCOUNTER — Telehealth: Payer: Self-pay | Admitting: Pharmacist

## 2019-12-29 DIAGNOSIS — Z7252 High risk homosexual behavior: Secondary | ICD-10-CM | POA: Diagnosis not present

## 2019-12-29 MED ORDER — DESCOVY 200-25 MG PO TABS: 1.0000 | ORAL_TABLET | Freq: Every day | ORAL | 2 refills | Status: DC

## 2019-12-29 NOTE — Telephone Encounter (Signed)
BIN [id number] PCN ACCESS Grp [id number] ID [id number]

## 2019-12-29 NOTE — Progress Notes (Addendum)
Date:  12/29/2019   HPI: George Becker is a 23 y.o. male who presents to the [facility] pharmacy clinic to discuss and initiate PrEP.  Insured   [x]    Uninsured  []    Patient Active Problem List   Diagnosis Date Noted   Positive hepatitis C antibody test 12/08/2019   Abnormality of systemic vein 08/12/2019   High risk bisexual behavior 08/12/2019   Depression with anxiety 05/27/2017   Continuous dependence on cigarette smoking 05/21/2017   Substance induced mood disorder (HCC) 03/16/2017   Asthma 01/17/2016   Substance abuse in remission (HCC) 10/04/2015    Patient's Medications  New Prescriptions   EMTRICITABINE-TENOFOVIR AF (DESCOVY) 200-25 MG TABLET    Take 1 tablet by mouth daily.  Previous Medications   ALBUTEROL (PROVENTIL) (2.5 MG/3ML) 0.083% NEBULIZER SOLUTION    Take 3 mLs (2.5 mg total) by nebulization every 8 (eight) hours as needed for wheezing or shortness of breath.   ALBUTEROL (VENTOLIN HFA) 108 (90 BASE) MCG/ACT INHALER    Inhale 2 puffs into the lungs every 6 (six) hours as needed for wheezing or shortness of breath.   BUPRENORPHINE HCL-NALOXONE HCL (SUBOXONE SL)    Place under the tongue.   ESOMEPRAZOLE (NEXIUM) 20 MG PACKET    Take 20 mg by mouth daily as needed.    FLUTICASONE-SALMETEROL (ADVAIR) 500-50 MCG/DOSE AEPB    Inhale 1 puff into the lungs 2 (two) times daily.   IBUPROFEN (ADVIL,MOTRIN) 200 MG TABLET    Take 600 mg by mouth every 6 (six) hours as needed for moderate pain.    MONTELUKAST (SINGULAIR) 10 MG TABLET    Take 1 tablet (10 mg total) by mouth daily as needed.   NALOXONE (NARCAN) 0.4 MG/ML INJECTION       TRIAMCINOLONE OINTMENT (KENALOG) 0.1 %    APPLY TO AFFECTED AREAS ON DAILY UNTIL CLEAR, THEN NEEDED *MAX PER INSURANCE*   VALACYCLOVIR (VALTREX) 1000 MG TABLET    TAKE 1 TABLET (1,000 MG) BY MOUTH DAILY  Modified Medications   No medications on file  Discontinued Medications   No medications on file    Allergies: Allergies  Allergen  Reactions   Amoxicillin Anaphylaxis and Hives   Augmentin [Amoxicillin-Pot Clavulanate] Anaphylaxis and Hives   Other Anaphylaxis    "all Nuts"   Peanut Butter Flavor    Penicillins Anaphylaxis    Past Medical History: Past Medical History:  Diagnosis Date   Anxiety    Asthma    Clotting disorder (HCC)    Substance abuse (HCC)     Social History: Social History   Socioeconomic History   Marital status: Single    Spouse name: Not on file   Number of children: 0   Years of education: High school   Highest education level: Not on file  Occupational History   Not on file  Tobacco Use   Smoking status: Current Every Day Smoker    Packs/day: 1.00    Years: 5.00    Pack years: 5.00    Types: Cigarettes   Smokeless tobacco: Never Used  Vaping Use   Vaping Use: Every day   Start date: 10/08/2019  Substance and Sexual Activity   Alcohol use: Not Currently    Alcohol/week: 0.0 standard drinks   Drug use: Not Currently    Types: Heroin, Methamphetamines    Comment: IV drugs   Sexual activity: Not on file  Other Topics Concern   Not on file  Social History Narrative  08/12/19   From: the area   Living: with Mom   Work: Just got a job at a [employer]       Family: gets along with mom      Enjoys: watching netflix      Exercise: not currently   Diet: soda, tries to eat better, trying to get weight back up      Safety   Seat belts: Yes    Guns: No   Safe in relationships: Yes    Social Determinants of Corporate investment banker Strain:    Difficulty of Paying Living Expenses:   Food Insecurity:    Worried About Programme researcher, broadcasting/film/video in the Last Year:    Barista in the Last Year:   Transportation Needs:    Freight forwarder (Medical):    Lack of Transportation (Non-Medical):   Physical Activity:    Days of Exercise per Week:    Minutes of Exercise per Session:   Stress:    Feeling of Stress :   Social  Connections:    Frequency of Communication with Friends and Family:    Frequency of Social Gatherings with Friends and Family:    Attends Religious Services:    Active Member of Clubs or Organizations:    Attends Banker Meetings:    Marital Status:     CHL HIV PREP FLOWSHEET RESULTS 12/29/2019  Insurance Status Insured  Gender at birth Male  Gender identity cis-Male  Risk for HIV In sexual relationship with HIV+ partner;Injection drug use  Sex Partners Men only  # sex partners past 3-6 mos 1  Sex activity preferences Insertive and receptive;Oral  Condom use Yes  % condom use 100  HIV symptoms? None    Labs:  SCr: Lab Results  Component Value Date   CREATININE 0.55 (L) 08/14/2019   CREATININE 0.65 09/06/2017   CREATININE 0.49 (L) 05/28/2017   CREATININE 0.66 05/27/2017   CREATININE 0.71 05/16/2017   HIV Lab Results  Component Value Date   HIV NON-REACTIVE 12/08/2019   HIV Non Reactive 05/27/2017   HIV NONREACTIVE 10/03/2016   HIV NON REACTIVE 10/04/2015   Hepatitis B Lab Results  Component Value Date   HEPBSAB NON-REACTIVE 12/08/2019   HEPBSAG NON-REACTIVE 12/08/2019   HEPBCAB NON-REACTIVE 12/08/2019   Hepatitis C Lab Results  Component Value Date   HEPCAB REACTIVE (A) 08/12/2019   HCVRNAPCRQN 595 (H) 12/08/2019   Hepatitis A Lab Results  Component Value Date   HAV REACTIVE (A) 12/08/2019   RPR and STI Lab Results  Component Value Date   LABRPR NON REAC 10/03/2016    STI Results GC CT  10/03/2016 NOT DETECTED NOT DETECTED    Assessment: George Becker is here today to discuss and initiate PrEP. He recently saw Rexene Alberts, NP at our office for his Hepatitis C infection. He is a previous IV drug user and has been clean for 6 months. He is also a MSM who has had a few partners in the last 3 months. He does use condoms 100% of the time. He has never been diagnosed with a STD and had a HIV test done when he was here seeing Judeth Cornfield on 6/9  which was negative.  He states that he has not been sexually active since that negative test. He is insured through [place]. He must fill his specialty medications through [facility]. Will start Descovy.  Counseled patient that Descovy is a one pill once daily  regimen with or without food that can prevent HIV. Discussed the importance of taking the medication daily to provide protection and decreased adherence is associated with decreased efficacy. Also discussed how Descovy works to prevent HIV but not other STDs and encouraged the use of condoms. Counseled on what to do if dose is missed, if closer to missed dose take immediately, if closer to next dose then skip and resume normal schedule.  Counseled patient that Descovy is normally well tolerated, however some patients experience a "start up syndrome" with nausea, diarrhea, dizziness, and fatigue but that those should resolve soon after starting.  Advised that any nausea can be mitigated by taking it with food. I reviewed patient medications and found no drug interactions. Discussed how our PrEP process works here at the clinic including follow ups and lab monitoring every 3 months.  Will see him back in 3 months.  I gave him a copay card. He must fill his Descovy at [facility]. Gave him the number to contact them tomorrow to set up shipment.  I will see him back in 3 months. He will call with any issues in the meantime.  I will check another Hepatitis C viral load in 3 months when he follows up.  Plan: - Start Descovy PO once daily - F/u with me 9/21 at 915am  Jmari Pelc L. Kalliope Riesen, PharmD, BCIDP, AAHIVP, CPP Clinical Pharmacist Practitioner Infectious Diseases Clinical Pharmacist [facility] 12/29/2019, 4:26 PM

## 2020-01-02 ENCOUNTER — Ambulatory Visit (HOSPITAL_COMMUNITY)
Admission: EM | Admit: 2020-01-02 | Discharge: 2020-01-02 | Disposition: A | Discharge status: Home / Self Care | Payer: No Typology Code available for payment source | Attending: Urgent Care | Admitting: Urgent Care

## 2020-01-02 ENCOUNTER — Other Ambulatory Visit: Payer: Self-pay | Admitting: Primary Care

## 2020-01-02 ENCOUNTER — Encounter (HOSPITAL_COMMUNITY): Payer: Self-pay

## 2020-01-02 ENCOUNTER — Other Ambulatory Visit: Payer: Self-pay

## 2020-01-02 DIAGNOSIS — H9202 Otalgia, left ear: Secondary | ICD-10-CM | POA: Diagnosis not present

## 2020-01-02 DIAGNOSIS — T7029XA Other effects of high altitude, initial encounter: Secondary | ICD-10-CM

## 2020-01-02 MED ORDER — PSEUDOEPHEDRINE HCL 60 MG PO TABS: 60.0000 mg | ORAL_TABLET | Freq: Three times a day (TID) | ORAL | 0 refills | Status: AC | PRN

## 2020-01-02 MED ORDER — NAPROXEN 375 MG PO TABS: 375.0000 mg | ORAL_TABLET | Freq: Two times a day (BID) | ORAL | 0 refills | Status: AC

## 2020-01-02 MED ORDER — CETIRIZINE HCL 10 MG PO TABS: 10.0000 mg | ORAL_TABLET | Freq: Every day | ORAL | 0 refills | Status: AC

## 2020-01-02 NOTE — Discharge Instructions (Signed)
Please do not use any more Q-tips to your ear.  It is okay to use a warm washcloth to clean your ear but do not place anything in your ear canal.  If your pain persists over this upcoming week then please contact [facility] ear nose throat doctor for consult.  In the meantime use naproxen for pain and inflammation.  You can also use Zyrtec and pseudoephedrine this week to help keep the sinuses and eustachian tube clear of fluid.

## 2020-01-02 NOTE — ED Provider Notes (Signed)
[facility]   MRN: [id number] DOB: March 18, 1996  Subjective:   George Becker is a 24 y.o. male presenting for acute onset of left ear pain with bleeding this morning.  Patient was trying to clean his ears using a Q-tip and felt that he hit his eardrum.  He became really concerned when he withdrew and had bleeding.  Pain is currently very mild and intermittent.  Not tried medications for relief.  Denies fever, sinus pain, cough, chest pain, shortness of breath.  No current facility-administered medications for this encounter.  Current Outpatient Medications:  .  albuterol (PROVENTIL) (2.5 MG/3ML) 0.083% nebulizer solution, Take 3 mLs (2.5 mg total) by nebulization every 8 (eight) hours as needed for wheezing or shortness of breath., Disp: 360 mL, Rfl: 5 .  albuterol (VENTOLIN HFA) 108 (90 Base) MCG/ACT inhaler, Inhale 2 puffs into the lungs every 6 (six) hours as needed for wheezing or shortness of breath., Disp: 8 g, Rfl: 3 .  Buprenorphine HCl-Naloxone HCl (SUBOXONE SL), Place under the tongue., Disp: , Rfl:  .  emtricitabine-tenofovir AF (DESCOVY) 200-25 MG tablet, Take 1 tablet by mouth daily., Disp: 30 tablet, Rfl: 2 .  esomeprazole (NEXIUM) 20 MG packet, Take 20 mg by mouth daily as needed. , Disp: , Rfl:  .  Fluticasone-Salmeterol (ADVAIR) 500-50 MCG/DOSE AEPB, Inhale 1 puff into the lungs 2 (two) times daily., Disp: 180 each, Rfl: 1 .  ibuprofen (ADVIL,MOTRIN) 200 MG tablet, Take 600 mg by mouth every 6 (six) hours as needed for moderate pain. , Disp: , Rfl:  .  montelukast (SINGULAIR) 10 MG tablet, Take 1 tablet (10 mg total) by mouth daily as needed. (Patient taking differently: Take 10 mg by mouth daily. ), Disp: 90 tablet, Rfl: 3 .  naloxone (NARCAN) 0.4 MG/ML injection, , Disp: , Rfl:  .  triamcinolone ointment (KENALOG) 0.1 %, APPLY TO AFFECTED AREAS ON DAILY UNTIL CLEAR, THEN NEEDED *MAX PER INSURANCE*, Disp: , Rfl:  .  valACYclovir (VALTREX) 1000 MG tablet, TAKE 1 TABLET  (1,000 MG) BY MOUTH DAILY, Disp: , Rfl: 99   Allergies  Allergen Reactions  . Amoxicillin Anaphylaxis and Hives  . Augmentin [Amoxicillin-Pot Clavulanate] Anaphylaxis and Hives  . Other Anaphylaxis    "all Nuts"  . Peanut Butter Flavor   . Penicillins Anaphylaxis    Past Medical History:  Diagnosis Date  . Anxiety   . Asthma   . Clotting disorder (HCC)   . Substance abuse [facility])      Past Surgical History:  Procedure Laterality Date  . NO PAST SURGERIES      Family History  Problem Relation Age of Onset  . Diabetes Mother   . Sleep apnea Mother   . Hypertension Father   . Asthma Maternal Grandfather   . Congestive Heart Failure Maternal Grandfather   . Other Maternal Grandfather        hypersensitivity pneumonitis  . Diabetes Maternal Grandfather   . Hypertension Maternal Grandmother   . Lymphoma Maternal Grandmother   . Diabetes Paternal Grandmother   . Hyperlipidemia Paternal Grandfather   . Hypertension Paternal Grandfather     Social History   Tobacco Use  . Smoking status: Current Every Day Smoker    Packs/day: 1.00    Years: 5.00    Pack years: 5.00    Types: Cigarettes  . Smokeless tobacco: Never Used  Vaping Use  . Vaping Use: Every day  . Start date: 10/08/2019  Substance Use Topics  . Alcohol use:  Not Currently    Alcohol/week: 0.0 standard drinks  . Drug use: Not Currently    Types: Heroin, Methamphetamines    Comment: IV drugs    ROS   Objective:   Vitals: BP 124/79 (BP Location: Right Arm)   Pulse 98   Temp 98.1 F (36.7 C) (Oral)   Resp 16   SpO2 98%   Physical Exam Constitutional:      General: He is not in acute distress.    Appearance: Normal appearance. He is well-developed and normal weight. He is not ill-appearing, toxic-appearing or diaphoretic.  HENT:     Head: Normocephalic and atraumatic.     Right Ear: External ear normal.     Left Ear: External ear normal. No decreased hearing noted. No laceration, drainage,  swelling or tenderness.  No middle ear effusion. There is no impacted cerumen. No foreign body. No mastoid tenderness. No PE tube. No hemotympanum. Tympanic membrane is injected. Tympanic membrane is not scarred, perforated, erythematous, retracted or bulging.     Ears:      Nose: Nose normal.     Mouth/Throat:     Pharynx: Oropharynx is clear.  Eyes:     General: No scleral icterus.       Right eye: No discharge.        Left eye: No discharge.     Extraocular Movements: Extraocular movements intact.     Pupils: Pupils are equal, round, and reactive to light.  Cardiovascular:     Rate and Rhythm: Normal rate.  Pulmonary:     Effort: Pulmonary effort is normal.  Musculoskeletal:     Cervical back: Normal range of motion.  Neurological:     Mental Status: He is alert and oriented to person, place, and time.  Psychiatric:        Mood and Affect: Mood normal.        Behavior: Behavior normal.        Thought Content: Thought content normal.        Judgment: Judgment normal.      Assessment and Plan :   PDMP not reviewed this encounter.  1. Left ear pain   2. Barotrauma, initial encounter     Counseled patient on general management of barotrauma, recommend follow-up with ENT.  Use Naprosyn as needed for any kind of pain, start Zyrtec and Sudafed as needed as well. Counseled patient on potential for adverse effects with medications prescribed/recommended today, ER and return-to-clinic precautions discussed, patient verbalized understanding.    Wallis Bamberg, [place] 01/02/20 1548

## 2020-01-02 NOTE — ED Triage Notes (Signed)
Pt presents with hearing decrease in the left ear since this morning. Pt states he was cleaning his left ear with a q tip last nigh.

## 2020-01-27 ENCOUNTER — Encounter: Payer: Self-pay | Admitting: Primary Care

## 2020-01-27 ENCOUNTER — Other Ambulatory Visit: Payer: Self-pay

## 2020-01-27 ENCOUNTER — Ambulatory Visit (INDEPENDENT_AMBULATORY_CARE_PROVIDER_SITE_OTHER): Payer: No Typology Code available for payment source | Admitting: Primary Care

## 2020-01-27 VITALS — BP 120/80 | HR 70 | Temp 97.3°F | Ht 70.0 in | Wt 154.4 lb

## 2020-01-27 DIAGNOSIS — J4550 Severe persistent asthma, uncomplicated: Secondary | ICD-10-CM

## 2020-01-27 NOTE — Progress Notes (Signed)
@Patient  ID: George Becker, male    DOB: 1996-01-03, 24 y.o.   MRN: [id number]  Chief Complaint  Patient presents with  . Follow-up    Asthma    Referring provider: Lesleigh Noe, MD  HPI: Thymoma stone 22 year, current smoker. PMH severe persistent asthma with recurrent exacerbations, polysubstance abuse, depression with anxiety. Former patient of Dr. Lake Bells, last seen by pulmonary NP on 07/12/19. Elevated eosinophils and IgE. Maintained on Advair 500/50 twice daily, prn albuterol hfa/neb and singulair.   Previous [facility] pulmonary encounter: 07/12/2019 Patient presents today for annual follow-up for asthma. Seen at [facility] ED in November for accidental drug overdose and asthma exacerbation requiring narcan. Discharged with prednisone taper and doxycycline. He has a long standing history of polysubstance abuse. He has been receiving treatment in [place] for substance abuse and recently just return to the area. He does not currently have PCP. States that his asthma has been ok. He admits to not using his inhalers requirly as prescribed. His asthma symptoms worsen with season change (typically summer/fall). He was seen at an UC around this time for asthma exacerbation requiring oral prednisone. He was also treated for strep throat with antibiotic. He has been needing to use his Albuterol rescue inhaler/nebulizer more. He ran out of Singulair and Advair three weeks ago. He has a chronic cough d/t smoking, no significant production. No active shortness of breath or wheezing today.   01/27/2020 Patient presents today for 6 month follow-up asthma. He is doing well. No recent asthma exacerbations. He is currently out of Advair, refill sent to pharamcy but has not yet picked up prescription. Breathing is exacerbated by exercise and heat/humidity. On a bad day he will need to use his albuterol rescue inhaler 5 times. He has had no recent significant asthma exacerbations in the last 6 months. Last  oral prednisone course was in Dec/January. Eos 1000; IgE 3,640. Previous drug abuse, currently clean. He consistently takes his asthma medication now. He experiences fairly significant eczema symptoms. Using steroids cream every night. When he stops steriod cream him symptoms flare up. He is using triamcinolone. Dr. Evorn Gong is his dermatologist in [place] Reactions  . Amoxicillin Anaphylaxis and Hives  . Augmentin [Amoxicillin-Pot Clavulanate] Anaphylaxis and Hives  . Other Anaphylaxis    "all Nuts"  . Peanut Butter Flavor   . Penicillins Anaphylaxis    Immunization History  Administered Date(s) Administered  . DTaP 05/30/1997, 08/01/1997, 09/20/1997, 06/29/1998, 12/29/2001  . HPV 9-valent 01/23/2015, 03/28/2015, 08/02/2015  . HPV Quadrivalent 01/23/2015, 03/28/2015, 08/02/2015  . Hepatitis A 09/01/2012, 12/14/2014  . Hepatitis A, Ped/Adol-2 Dose 12/14/2014  . Hepatitis B 04/01/1997, 05/30/1997, 12/21/1997  . HiB (PRP-OMP) 05/30/1997, 08/01/1997, 09/20/1997, 06/29/1998  . IPV 05/30/1997, 08/01/1997, 04/03/1998, 12/29/2001  . Influenza-Unspecified 05/21/2005, 05/14/2006, 04/14/2007, 04/19/2008, 05/23/2010, 04/21/2012  . MMR 06/29/1998, 12/29/2001  . Meningococcal Conjugate 12/14/2014  . Meningococcal Polysaccharide 09/01/2012  . PFIZER SARS-COV-2 Vaccination 09/25/2019, 10/16/2019  . Rotavirus Pentavalent 08/01/1997, 10/31/1997, 12/21/1997  . Tdap 09/15/2008  . Varicella 04/03/1998, 06/19/2006    Past Medical History:  Diagnosis Date  . Anxiety   . Asthma   . Clotting disorder ([place])   . Substance abuse ([place])     Tobacco History: Social History   Tobacco Use  Smoking Status Current Every Day Smoker  . Packs/day: 1.00  . Years: 5.00  . Pack years: 5.00  . Types: Cigarettes  Smokeless Tobacco Never Used  Tobacco Comment   1 to 2 ciggs  a day   Ready to quit: No Counseling given: Yes Comment: 1 to 2 ciggs a day   Outpatient Medications Prior  to Visit  Medication Sig Dispense Refill  . albuterol (PROVENTIL) (2.5 MG/3ML) 0.083% nebulizer solution Take 3 mLs (2.5 mg total) by nebulization every 8 (eight) hours as needed for wheezing or shortness of breath. 360 mL 5  . albuterol (VENTOLIN HFA) 108 (90 Base) MCG/ACT inhaler Inhale 2 puffs into the lungs every 6 (six) hours as needed for wheezing or shortness of breath. 8 g 3  . Buprenorphine HCl-Naloxone HCl (SUBOXONE SL) Place under the tongue.    . cetirizine (ZYRTEC ALLERGY) 10 MG tablet Take 1 tablet (10 mg total) by mouth daily. 30 tablet 0  . emtricitabine-tenofovir AF (DESCOVY) 200-25 MG tablet Take 1 tablet by mouth daily. 30 tablet 2  . esomeprazole (NEXIUM) 20 MG packet Take 20 mg by mouth daily as needed.     . Fluticasone-Salmeterol (ADVAIR) 500-50 MCG/DOSE AEPB Inhale 1 puff into the lungs 2 (two) times daily. 180 each 1  . ibuprofen (ADVIL,MOTRIN) 200 MG tablet Take 600 mg by mouth every 6 (six) hours as needed for moderate pain.     . naloxone (NARCAN) 0.4 MG/ML injection     . naproxen (NAPROSYN) 375 MG tablet Take 1 tablet (375 mg total) by mouth 2 (two) times daily with a meal. 30 tablet 0  . pseudoephedrine (SUDAFED) 60 MG tablet Take 1 tablet (60 mg total) by mouth every 8 (eight) hours as needed for congestion. 30 tablet 0  . triamcinolone ointment (KENALOG) 0.1 % APPLY TO AFFECTED AREAS ON DAILY UNTIL CLEAR, THEN NEEDED *MAX PER INSURANCE*    . valACYclovir (VALTREX) 1000 MG tablet TAKE 1 TABLET (1,000 MG) BY MOUTH DAILY  99  . valACYclovir (VALTREX) 500 MG tablet Take 500 mg by mouth daily.    . montelukast (SINGULAIR) 10 MG tablet Take 1 tablet (10 mg total) by mouth daily as needed. (Patient taking differently: Take 10 mg by mouth daily. ) 90 tablet 3   No facility-administered medications prior to visit.    Review of Systems  Review of Systems  Constitutional: Negative.   Respiratory: Positive for cough. Negative for chest tightness, shortness of breath and  wheezing.   Skin: Positive for rash.  Allergic/Immunologic: Positive for environmental allergies.   Physical Exam  BP 120/80 (BP Location: Left Arm, Cuff Size: Normal)   Pulse 70   Temp (!) 97.3 F (36.3 C) (Oral)   Ht 5' 10"  (1.778 m)   Wt 154 lb 6.4 oz (70 kg)   SpO2 98%   BMI 22.15 kg/m  Physical Exam Constitutional:      Appearance: Normal appearance.  HENT:     Head: Normocephalic and atraumatic.  Cardiovascular:     Rate and Rhythm: Normal rate and regular rhythm.  Pulmonary:     Effort: Pulmonary effort is normal.     Breath sounds: Normal breath sounds. No wheezing or rhonchi.     Comments: CTA Neurological:     Mental Status: He is alert.  Psychiatric:        Mood and Affect: Mood normal.        Behavior: Behavior normal.        Thought Content: Thought content normal.        Judgment: Judgment normal.      Lab Results:  CBC    Component Value Date/Time   WBC 6.4 08/14/2019 2151   RBC 4.93  08/14/2019 2151   HGB 14.7 08/14/2019 2151   HCT 42.1 08/14/2019 2151   PLT 211 08/14/2019 2151   MCV 85.4 08/14/2019 2151   MCH 29.8 08/14/2019 2151   MCHC 34.9 08/14/2019 2151   RDW 13.2 08/14/2019 2151   LYMPHSABS 2.1 07/12/2019 1108   MONOABS 0.6 07/12/2019 1108   EOSABS 1.0 (H) 07/12/2019 1108   BASOSABS 0.0 07/12/2019 1108    BMET    Component Value Date/Time   NA 139 08/14/2019 2151   K 3.8 08/14/2019 2151   CL 100 08/14/2019 2151   CO2 31 08/14/2019 2151   GLUCOSE 114 (H) 08/14/2019 2151   BUN 19 08/14/2019 2151   CREATININE 0.55 (L) 08/14/2019 2151   CREATININE 0.67 10/03/2016 2055   CALCIUM 9.2 08/14/2019 2151   GFRNONAA >60 08/14/2019 2151   GFRNONAA >89 10/03/2016 2055   GFRAA >60 08/14/2019 2151   GFRAA >89 10/03/2016 2055    BNP No results found for: BNP  ProBNP No results found for: PROBNP  Imaging: No results found.   Assessment & Plan:   Asthma - Asthma is well controlled on high dose ICS/LABA inhaler and leukotriene  inhibitor, he had one exacerbation this year requiring oral steroids from an urgent care. Eczema symptoms require daily steroid cream to control. He has significantly elevated allergy markers. Plan to refer to [facility] (Dr. Neldon Mc or Gallegher) for consideration for starting biologic therapy and will follow-up afterward with Dr. Vaughan Browner to establish care with him    Martyn Ehrich, NP 01/27/2020

## 2020-01-27 NOTE — Patient Instructions (Addendum)
Its great seeing you again today George Becker  Allergy testing: - Eos 1000 - IgE 3,640  Recommendations: - Continue Advair 1 puff twice daily; Albuterol rescue inhaler 2 puffs every 4-6 hours for breakthrough shortness of breath or wheezing - Continue Singulair 10 mg at bedtime   Referral:  - Dr. Lucie Leather or Dellis Anes with [facility]  [address] [phone]  Follow-up: - 3 months with Dr. Isaiah Serge or first available with him (only) - new patient

## 2020-01-27 NOTE — Assessment & Plan Note (Signed)
-   Asthma is well controlled on high dose ICS/LABA inhaler and leukotriene inhibitor, he had one exacerbation this year requiring oral steroids from an urgent care. Eczema symptoms require daily steroid cream to control. He has significantly elevated allergy markers. Plan to refer to [facility] (Dr. Lucie Leather or Gallegher) for consideration for starting biologic therapy and will follow-up afterward with Dr. Isaiah Serge to establish care with him

## 2020-01-31 ENCOUNTER — Other Ambulatory Visit: Payer: Self-pay | Admitting: Primary Care

## 2020-02-20 ENCOUNTER — Other Ambulatory Visit: Payer: Self-pay | Admitting: Primary Care

## 2020-03-17 ENCOUNTER — Other Ambulatory Visit: Payer: Self-pay | Admitting: Primary Care

## 2020-03-21 ENCOUNTER — Ambulatory Visit: Payer: No Typology Code available for payment source | Admitting: Pharmacist

## 2020-04-10 ENCOUNTER — Other Ambulatory Visit: Payer: Self-pay

## 2020-04-10 ENCOUNTER — Ambulatory Visit (INDEPENDENT_AMBULATORY_CARE_PROVIDER_SITE_OTHER): Payer: No Typology Code available for payment source | Admitting: Pharmacist

## 2020-04-10 DIAGNOSIS — Z79899 Other long term (current) drug therapy: Secondary | ICD-10-CM | POA: Diagnosis not present

## 2020-04-10 DIAGNOSIS — R768 Other specified abnormal immunological findings in serum: Secondary | ICD-10-CM

## 2020-04-10 NOTE — Progress Notes (Signed)
Date:  04/10/2020   HPI: George Becker is a 24 y.o. male who presents to the [facility] pharmacy clinic for HIV PrEP follow-up. PMH significant for IVDU.  Insured   [x]    Uninsured  []    Patient Active Problem List   Diagnosis Date Noted  . Positive hepatitis C antibody test 12/08/2019  . Abnormality of systemic vein 08/12/2019  . 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 Depression with anxiety 05/27/2017  . Continuous dependence on cigarette smoking 05/21/2017  . Substance induced mood disorder (H[facility]) 03/16/2017  . Asthma 01/17/2016  . Substance abuse in remission ([facility]) 10/04/2015    Patient's Medications  New Prescriptions   No medications on file  Previous Medications   ADVAIR DISKUS 500-50 MCG/DOSE AEPB    TAKE 1 PUFF BY MOUTH TWICE A DAY   ALBUTEROL (PROVENTIL) (2.5 MG/3ML) 0.083% NEBULIZER SOLUTION    Take 3 mLs (2.5 mg total) by nebulization every 8 (eight) hours as needed for wheezing or shortness of breath.   ALBUTEROL (VENTOLIN HFA) 108 (90 BASE) MCG/ACT INHALER    TAKE 2 PUFFS BY MOUTH EVERY 6 HOURS AS NEEDED FOR WHEEZE OR SHORTNESS OF BREATH   BUPRENORPHINE HCL-NALOXONE HCL (SUBOXONE SL)    Place under the tongue.   CETIRIZINE (ZYRTEC ALLERGY) 10 MG TABLET    Take 1 tablet (10 mg total) by mouth daily.   EMTRICITABINE-TENOFOVIR AF (DESCOVY) 200-25 MG TABLET    Take 1 tablet by mouth daily.   ESOMEPRAZOLE (NEXIUM) 20 MG PACKET    Take 20 mg by mouth daily as needed.    IBUPROFEN (ADVIL,MOTRIN) 200 MG TABLET    Take 600 mg by mouth every 6 (six) hours as needed for moderate pain.    MONTELUKAST (SINGULAIR) 10 MG TABLET    Take 1 tablet (10 mg total) by mouth daily as needed.   NALOXONE (NARCAN) 0.4 MG/ML INJECTION       NAPROXEN (NAPROSYN) 375 MG TABLET    Take 1 tablet (375 mg total) by mouth 2 (two) times daily with a meal.   PSEUDOEPHEDRINE (SUDAFED) 60 MG TABLET    Take 1 tablet (60 mg total) by mouth every 8 (eight) hours as needed for congestion.   TRIAMCINOLONE  OINTMENT (KENALOG) 0.1 %    APPLY TO AFFECTED AREAS ON DAILY UNTIL CLEAR, THEN NEEDED *MAX PER INSURANCE*   VALACYCLOVIR (VALTREX) 1000 MG TABLET    TAKE 1 TABLET (1,000 MG) BY MOUTH DAILY  Modified Medications   No medications on file  Discontinued Medications   No medications on file    Allergies: Allergies  Allergen Reactions  . Amoxicillin Anaphylaxis and Hives  . Augmentin [Amoxicillin-Pot Clavulanate] Anaphylaxis and Hives  . Other Anaphylaxis    "all Nuts"  . Peanut Butter Flavor   . Penicillins Anaphylaxis    Past Medical History: Past Medical History:  Diagnosis Date  . Anxiety   . Asthma   . Clotting disorder (H[facility])   . Substance abuse ([facility])     Social History: Social History   Socioeconomic History  . Marital status: Single    Spouse name: Not on file  . Number of children: 0  . Years of education: High school  . Highest education level: Not on file  Occupational History  . Not on file  Tobacco Use  . Smoking status: Current Every Day Smoker    Packs/day: 1.00    Years: 5.00    Pack years: 5.00    Types: Cigarettes  .  Smokeless tobacco: Never Used  . Tobacco comment: 1 to 2 ciggs a day  Vaping Use  . Vaping Use: Every day  . Start date: 10/08/2019  Substance and Sexual Activity  . Alcohol use: Not Currently    Alcohol/week: 0.0 standard drinks  . Drug use: Not Currently    Types: Heroin, Methamphetamines    Comment: IV drugs  . Sexual activity: Not on file  Other Topics Concern  . Not on file  Social History Narrative   08/12/19   From: the area   Living: with Mom   Work: Just got a job at a [employer]       Family: gets along with mom      Enjoys: watching netflix      Exercise: not currently   Diet: soda, tries to eat better, trying to get weight back up      Safety   Seat belts: Yes    Guns: No   Safe in relationships: Yes    Social Determinants of Health   Financial Resource Strain:   . Difficulty of Paying Living Expenses:  Not on file  Food Insecurity:   . Worried About Programme researcher, broadcasting/film/video in the Last Year: Not on file  . Ran Out of Food in the Last Year: Not on file  Transportation Needs:   . Lack of Transportation (Medical): Not on file  . Lack of Transportation (Non-Medical): Not on file  Physical Activity:   . Days of Exercise per Week: Not on file  . Minutes of Exercise per Session: Not on file  Stress:   . Feeling of Stress : Not on file  Social Connections:   . Frequency of Communication with Friends and Family: Not on file  . Frequency of Social Gatherings with Friends and Family: Not on file  . Attends Religious Services: Not on file  . Active Member of Clubs or Organizations: Not on file  . Attends Banker Meetings: Not on file  . Marital Status: Not on file    CHL HIV PREP FLOWSHEET RESULTS 12/29/2019  Insurance Status Insured  Gender at birth Male  Gender identity cis-Male  Risk for HIV In sexual relationship with HIV+ partner;Injection drug use  Sex Partners Men only  # sex partners past 3-6 mos 1  Sex activity preferences Insertive and receptive;Oral  Condom use Yes  % condom use 100  HIV symptoms? None    Labs:  SCr: Lab Results  Component Value Date   CREATININE 0.55 (L) 08/14/2019   CREATININE 0.65 09/06/2017   CREATININE 0.49 (L) 05/28/2017   CREATININE 0.66 05/27/2017   CREATININE 0.71 05/16/2017   HIV Lab Results  Component Value Date   HIV NON-REACTIVE 12/08/2019   HIV Non Reactive 05/27/2017   HIV NONREACTIVE 10/03/2016   HIV NON REACTIVE 10/04/2015   Hepatitis B Lab Results  Component Value Date   HEPBSAB NON-REACTIVE 12/08/2019   HEPBSAG NON-REACTIVE 12/08/2019   HEPBCAB NON-REACTIVE 12/08/2019   Hepatitis C Lab Results  Component Value Date   HEPCAB REACTIVE (A) 08/12/2019   HCVRNAPCRQN 595 (H) 12/08/2019   Hepatitis A Lab Results  Component Value Date   HAV REACTIVE (A) 12/08/2019   RPR and STI Lab Results  Component Value  Date   LABRPR NON REAC 10/03/2016    STI Results GC CT  10/03/2016 NOT DETECTED NOT DETECTED    Assessment: George Becker presents today in good spirits for his PrEP follow-up visit after missing his appointment  in September 2021. Reports he took Descovy for 4 days, however stopped taking medication due to dizziness and nausea. Reports taking Suboxone which also causes similar side-effects and did not want side-effects to worsen with the addition of Descovy. Patient prefers to complete Suboxone therapy and then restart Descovy. Reports 1.5 months left with Suboxone. Patient states he has not had sexual activity since his last visit in June 2021, therefore declined STD and HIV testing. Patient also has history of untreated Hepatitis C with fibrosis score of F0. HCV RNA viral load increased from 56 to 595 as of June 2021. Will check HCV viral load and HCV genotype to determine if patient has spontaneously cleared virus or if treatment is warranted. Declined flu shot. Will order labs and see him back in 2 months.   Plan: 1. Restart Descovy after completion of Suboxone therapy 2. Labs: HCV viral load, HCV genotype, BMET 3. Follow-up in 2 month for PrEP visit and check HIV ab.  Fabio Neighbors, PharmD PGY2 Ambulatory Care Resident [facility]  Pharmacy

## 2020-04-14 LAB — BASIC METABOLIC PANEL
BUN: 11 mg/dL (ref 7–25)
CO2: 34 mmol/L — ABNORMAL HIGH (ref 20–32)
Calcium: 9.6 mg/dL (ref 8.6–10.3)
Chloride: 99 mmol/L (ref 98–110)
Creat: 0.68 mg/dL (ref 0.60–1.35)
Glucose, Bld: 98 mg/dL (ref 65–99)
Potassium: 4.1 mmol/L (ref 3.5–5.3)
Sodium: 138 mmol/L (ref 135–146)

## 2020-04-14 LAB — HEPATITIS C RNA QUANTITATIVE
HCV RNA, PCR, QN (Log): 4.53 log IU/mL — ABNORMAL HIGH
HCV RNA, PCR, QN: 34200 IU/mL — ABNORMAL HIGH

## 2020-04-14 LAB — HEPATITIS C GENOTYPE: HCV Genotype: 3

## 2020-04-14 NOTE — Progress Notes (Signed)
Looks like his Hepatitis C is chronic. We will move forward with treating him!

## 2020-04-17 ENCOUNTER — Other Ambulatory Visit: Payer: Self-pay | Admitting: Pharmacist

## 2020-04-17 ENCOUNTER — Telehealth: Payer: Self-pay

## 2020-04-17 DIAGNOSIS — B182 Chronic viral hepatitis C: Secondary | ICD-10-CM

## 2020-04-17 MED ORDER — MAVYRET 100-40 MG PO TABS: 3.0000 | ORAL_TABLET | Freq: Every day | ORAL | 1 refills | Status: DC

## 2020-04-17 NOTE — Progress Notes (Signed)
Sending Mavyret to [facility] for patient's chronic Hepatitis C infection. Lupita Leash will work on [place] for medication.

## 2020-04-17 NOTE — Telephone Encounter (Signed)
[facility] Patient Advocate Encounter   Received notification from [employer]  that prior authorization for MAVYRET is required.   PA submitted on 04/17/20 Key [id number] Status is pending    [facility] Clinic will continue to follow.   Netty Starring. Dimas Aguas CPhT Specialty Pharmacy Patient [facility] Phone: [phone] Fax:  [phone]

## 2020-04-24 ENCOUNTER — Other Ambulatory Visit: Payer: Self-pay | Admitting: Pharmacist

## 2020-04-24 DIAGNOSIS — B182 Chronic viral hepatitis C: Secondary | ICD-10-CM

## 2020-04-24 MED ORDER — SOFOSBUVIR-VELPATASVIR 400-100 MG PO TABS: 1.0000 | ORAL_TABLET | Freq: Every day | ORAL | 2 refills | Status: DC

## 2020-04-24 NOTE — Progress Notes (Signed)
Medication required to go to [facility] per patient's insurance. Sending there now. Lupita Leash will coordinate.

## 2020-04-24 NOTE — Progress Notes (Signed)
Patient's insurance will not pay for Mavyret. Sending in Epclusa x 12 weeks instead.

## 2020-04-25 ENCOUNTER — Telehealth: Payer: Self-pay

## 2020-04-25 ENCOUNTER — Telehealth: Payer: Self-pay | Admitting: Pharmacist

## 2020-04-25 NOTE — Telephone Encounter (Signed)
[facility] Patient Advocate Encounter  Prior Authorization for [employer] has been approved.     Effective dates: 04/24/20 through 07/17/20   [facility] will continue to follow.  Clearance Coots, CPhT Specialty Pharmacy Patient [facility] for Infectious Disease Phone: [phone] Fax:  [phone]

## 2020-04-25 NOTE — Telephone Encounter (Cosign Needed)
Patient is approved to receive Epclusa x 12 weeks for chronic Hepatitis C infection. Counseled patient to take Epclusa daily with or without food. Encouraged patient not to miss any doses and explained how their chance of cure could go down with each dose missed. Counseled patient on what to do if dose is missed - if it is closer to the missed dose take immediately; if closer to next dose skip dose and take the next dose at the usual time. Counseled patient on common side effects such as headache, fatigue, and nausea and that these normally decrease with time. I reviewed patient medications and found no drug interactions. Discussed with patient that there are several drug interactions including acid suppressants. Instructed patient to call clinic if he wishes to start a new medication during course of therapy. Also advised patient to call if he experiences any side effects. Patient will follow-up with Cassie in the pharmacy clinic once details with [facility] are worked out.

## 2020-06-05 ENCOUNTER — Telehealth: Payer: Self-pay

## 2020-06-05 ENCOUNTER — Ambulatory Visit: Payer: No Typology Code available for payment source | Admitting: Pharmacist

## 2020-06-05 NOTE — Telephone Encounter (Signed)
Patient left VM saying he would be unable to make appointment and needs to reschedule.   Sandie Ano, RN

## 2020-06-05 NOTE — Telephone Encounter (Signed)
Rescheduled patient for 12/20. Patient states he has not been taking PrEP due to being on Epclusa.   Sandie Ano, RN

## 2020-06-05 NOTE — Telephone Encounter (Signed)
The front desk can reschedule him. Thank you!

## 2020-06-19 ENCOUNTER — Ambulatory Visit: Payer: No Typology Code available for payment source | Admitting: Pharmacist

## 2020-06-20 ENCOUNTER — Emergency Department
Admission: EM | Admit: 2020-06-20 | Discharge: 2020-06-20 | Disposition: A | Discharge status: Home / Self Care | Payer: No Typology Code available for payment source | Attending: Emergency Medicine | Admitting: Emergency Medicine

## 2020-06-20 ENCOUNTER — Other Ambulatory Visit: Payer: Self-pay

## 2020-06-20 ENCOUNTER — Emergency Department: Payer: No Typology Code available for payment source

## 2020-06-20 ENCOUNTER — Telehealth: Payer: Self-pay

## 2020-06-20 DIAGNOSIS — M21371 Foot drop, right foot: Secondary | ICD-10-CM | POA: Diagnosis not present

## 2020-06-20 DIAGNOSIS — M79604 Pain in right leg: Secondary | ICD-10-CM

## 2020-06-20 DIAGNOSIS — Z91010 Allergy to peanuts: Secondary | ICD-10-CM | POA: Insufficient documentation

## 2020-06-20 DIAGNOSIS — J45909 Unspecified asthma, uncomplicated: Secondary | ICD-10-CM | POA: Diagnosis not present

## 2020-06-20 DIAGNOSIS — F17210 Nicotine dependence, cigarettes, uncomplicated: Secondary | ICD-10-CM | POA: Insufficient documentation

## 2020-06-20 DIAGNOSIS — R200 Anesthesia of skin: Secondary | ICD-10-CM | POA: Insufficient documentation

## 2020-06-20 MED ORDER — PREDNISONE 10 MG PO TABS: ORAL_TABLET | ORAL | 0 refills | Status: AC

## 2020-06-20 NOTE — Telephone Encounter (Signed)
Would recommend appointment to evaluate.   If able to reach patient and having other neurological findings he may need to consider going to the ER but if this developed over a few days then appt in the office may be OK

## 2020-06-20 NOTE — Telephone Encounter (Signed)
Unable to reach pts mom by phone; sending note to DR Selena Batten and [place] CMA.

## 2020-06-20 NOTE — Telephone Encounter (Signed)
[facility] - Client TELEPHONE ADVICE RECORD AccessNurse Patient Name: George Becker Gender: Male DOB: 23-Feb-1996 Age: 24 Y 2 M 21 D Return Phone Number: [phone] (Primary) Address[place] [address] Client [facility] - Client Client Site [facility] - Day Physician Gweneth Dimitri- MD Contact Type Call Who Is Calling Patient / Member / Family / Caregiver Call Type Triage / Clinical Caller Name Evan Mackie Relationship To Patient Mother Return Phone Number [phone] (Primary) Chief Complaint NUMBNESS/TINGLING- sudden on one side of the body or face Reason for Call Symptomatic / Request for Health Information Initial Comment Caller states that her son has numbness in his foot and leg and he cannot bend it. Translation No Nurse Assessment Nurse: Su Hilt, RN, Lupita Leash Date/Time Lamount Cohen Time): 06/20/2020 11:30:20 AM Confirm and document reason for call. If symptomatic, describe symptoms. ---Caller states her son developed symptoms of numbness in his foot yesterday and is unable to stand, developed foot drop. States he recently developed ankle pain and then suddenly he developed the numbness. Does the patient have any new or worsening symptoms? ---Yes Will a triage be completed? ---Yes Related visit to physician within the last 2 weeks? ---No Does the PT have any chronic conditions? (i.e. diabetes, asthma, this includes High risk factors for pregnancy, etc.) ---Yes List chronic conditions. ---Hep C, Asthma, Substance Abuse disorder Is this a behavioral health or substance abuse call? ---No Guidelines Guideline Title Affirmed Question Affirmed Notes Nurse Date/Time (Eastern Time) Neurologic Deficit [1] Weakness (i.e., paralysis, loss of muscle strength) of the face, arm / hand, or leg / foot on one side of the body AND [2] sudden onset AND [3] present now (Exception:  Bell's palsy suspected [i.e., weakness only on one side of the Maylon Cos 06/20/2020 11:33:09 AM PLEASE NOTE: All timestamps contained within this report are represented as [place] Standard Time. CONFIDENTIALTY NOTICE: This fax transmission is intended only for the addressee. It contains information that is legally privileged, confidential or otherwise protected from use or disclosure. If you are not the intended recipient, you are strictly prohibited from reviewing, disclosing, copying using or disseminating any of this information or taking any action in reliance on or regarding this information. If you have received this fax in error, please notify us immediately by telephone so that we can arrange for its return to [place]. Phone: [phone], Toll-Free: [phone], Fax: [phone] Page: 2 of 2 Call Id: [id number] Guidelines Guideline Title Affirmed Question Affirmed Notes Nurse Date/Time Lamount Cohen Time) face, developing over hours to days, no other symptoms]) Disp. Time Lamount Cohen Time) Disposition Final User 06/20/2020 11:27:31 AM Send to Urgent Queue Brooke Pace 06/20/2020 11:39:10 AM 911 Outcome Documentation Su Hilt, RN, Lupita Leash Reason: Family member takes to the ED. 06/20/2020 11:38:27 AM Call EMS 911 Now Yes Su Hilt, RN, Oletta Cohn Disagree/Comply Disagree Caller Understands Yes PreDisposition Did not know what to do Care Advice Given Per Guideline CALL EMS 911 NOW: * Immediate medical attention is needed. You need to hang up and call 911 (or an ambulance). * Triager Discretion: I'll call you back in a few minutes to be sure you were able to reach them. Comments User: Rocky Link, RN Date/Time Lamount Cohen Time): 06/20/2020 11:38:25 AM Caller states she knows her son will not call 911, but she will attempt to take him to the ED. She states she is an Charity fundraiser and knows he needs immediate medical attention but states he usually refuses care. Referrals GO  TO FACILITY  UNDECIDED

## 2020-06-20 NOTE — ED Triage Notes (Signed)
Pt here via POV.  Pt reports hurting his R ankle several days ago, pt states he is unsure how he hurt it, denies falling. Reports two days ago his entire right lower leg from his knee down to his foot is now numb, more so on the outside. Pt reports trouble walking due to lack of sensation. Denies pain. Skin warm and dry.

## 2020-06-20 NOTE — Telephone Encounter (Addendum)
Pts mom called back and pt went with his father to [facility] to get evaluated and any needed testing. Numbness is one of pts symptoms at this time along with foot drop which pts mom said are both new symptoms. pts mom will cb with update on pt; FYI to Dr Selena Batten. Per chart review tab pt is at [facility] ED now.

## 2020-06-20 NOTE — ED Provider Notes (Signed)
[facility] Emergency Department Provider Note  ____________________________________________   Event Date/Time   First MD Initiated Contact with Patient 06/20/20 1547     (approximate)  I have reviewed the triage vital signs and the nursing notes.   HISTORY  Chief Complaint Leg Pain   HPI Ezeriah Luty is a 24 y.o. male who presents to the emergency department for evaluation of right lower extremity numbness, pain.  The patient reports initially had right ankle pain approximately 1 week ago and then reports 2 to 3 days ago he began experiencing numbness of the lateral aspect of the right lower leg from the knee down to the lateral aspect of the foot.  Patient reports increased issues with walking secondary to the sensation.  Denies any current pain with the complaint.  Denies any back pain, back injury.  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  He reports several months ago having a phlebitis of his upper extremity secondary to his injection use and is concerned this could have happened again.  Patient denies any chest pain, shortness of breath, cough, abdominal pain, fevers.         Past Medical History:  Diagnosis Date  . Anxiety   . Asthma   . Clotting disorder (HCC)   . Substance abuse [facility])     Patient Active Problem List   Diagnosis Date Noted  . Positive hepatitis C antibody test 12/08/2019  . Abnormality of systemic vein 08/12/2019  . 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 Depression with anxiety 05/27/2017  . Continuous dependence on cigarette smoking 05/21/2017  . Substance induced mood disorder (HCC) 03/16/2017  . Asthma 01/17/2016  . Substance abuse in remission (HCC) 10/04/2015    Past Surgical History:  Procedure Laterality Date  . NO PAST SURGERIES      Prior to Admission medications    Medication Sig Start Date End Date Taking? Authorizing Provider  ADVAIR DISKUS 500-50 MCG/DOSE AEPB TAKE 1 PUFF BY MOUTH TWICE A DAY 03/17/20   Glenford Bayley, NP  albuterol (PROVENTIL) (2.5 MG/3ML) 0.083% nebulizer solution Take 3 mLs (2.5 mg total) by nebulization every 8 (eight) hours as needed for wheezing or shortness of breath. 07/12/19   Glenford Bayley, NP  albuterol (VENTOLIN HFA) 108 (90 Base) MCG/ACT inhaler TAKE 2 PUFFS BY MOUTH EVERY 6 HOURS AS NEEDED FOR WHEEZE OR SHORTNESS OF BREATH 02/24/20   Glenford Bayley, NP  Buprenorphine HCl-Naloxone HCl (SUBOXONE SL) Place under the tongue.    [provider]  cetirizine (ZYRTEC ALLERGY) 10 MG tablet Take 1 tablet (10 mg total) by mouth daily. 01/02/20   Wallis Bamberg, PA-C  esomeprazole (NEXIUM) 20 MG packet Take 20 mg by mouth daily as needed.     [provider]  ibuprofen (ADVIL,MOTRIN) 200 MG tablet Take 600 mg by mouth every 6 (six) hours as needed for moderate pain.     [provider]  montelukast (SINGULAIR) 10 MG tablet Take 1 tablet (10 mg total) by mouth daily as needed. Patient taking differently: Take 10 mg by mouth daily.  07/12/19 08/12/19  Glenford Bayley, NP  naloxone [facility]) 0.4 MG/ML injection  05/28/19   [provider]  naproxen (NAPROSYN) 375 MG tablet Take 1 tablet (375 mg total) by mouth 2 (two) times daily with a meal. 01/02/20  Wallis Bamberg, PA-C  predniSONE (DELTASONE) 10 MG tablet Take 6 tablets (60 mg total) by mouth daily for 1 day, THEN 5 tablets (50 mg total) daily for 1 day, THEN 4 tablets (40 mg total) daily for 1 day, THEN 3 tablets (30 mg total) daily for 1 day, THEN 2 tablets (20 mg total) daily for 1 day, THEN 1 tablet (10 mg total) daily for 1 day. 06/20/20 06/26/20  Lucy Chris, PA  pseudoephedrine (SUDAFED) 60 MG tablet Take 1 tablet (60 mg total) by mouth every 8 (eight) hours as needed for congestion. 01/02/20   Wallis Bamberg, PA-C  Sofosbuvir-Velpatasvir  (EPCLUSA) 400-100 MG TABS Take 1 tablet by mouth daily. 04/24/20   Kuppelweiser, Cassie L, RPH-CPP  triamcinolone ointment (KENALOG) 0.1 % APPLY TO AFFECTED AREAS ON DAILY UNTIL CLEAR, THEN NEEDED *MAX PER INSURANCE* 07/12/19   [provider]  valACYclovir (VALTREX) 1000 MG tablet TAKE 1 TABLET (1,000 MG) BY MOUTH DAILY 03/15/18   [provider]    Allergies Amoxicillin, Augmentin [amoxicillin-pot clavulanate], Other, Peanut butter flavor, and Penicillins  Family History  Problem Relation Age of Onset  . Diabetes Mother   . Sleep apnea Mother   . Hypertension Father   . Asthma Maternal Grandfather   . Congestive Heart Failure Maternal Grandfather   . Other Maternal Grandfather        hypersensitivity pneumonitis  . Diabetes Maternal Grandfather   . Hypertension Maternal Grandmother   . Lymphoma Maternal Grandmother   . Diabetes Paternal Grandmother   . Hyperlipidemia Paternal Grandfather   . Hypertension Paternal Grandfather     Social History Social History   Tobacco Use  . Smoking status: Current Every Day Smoker    Packs/day: 1.00    Years: 5.00    Pack years: 5.00    Types: Cigarettes  . Smokeless tobacco: Never Used  . Tobacco comment: 1 to 2 ciggs a day  Vaping Use  . Vaping Use: Former  . Start date: 10/08/2019  Substance Use Topics  . Alcohol use: Yes    Alcohol/week: 0.0 standard drinks    Comment: socially   . Drug use: Not Currently    Types: Heroin, Methamphetamines, Marijuana    Comment: IV drugs    Review of Systems Constitutional: No fever/chills Eyes: No visual changes. ENT: No sore throat. Cardiovascular: Denies chest pain. Respiratory: Denies shortness of breath. Gastrointestinal: No abdominal pain.  No nausea, no vomiting.  No diarrhea.  No constipation. Genitourinary: Negative for dysuria. Musculoskeletal: Negative for back pain. Skin: Negative for rash. Neurological: + Right lower extremity numbness,  weakness   ____________________________________________   PHYSICAL EXAM:  VITAL SIGNS: ED Triage Vitals [06/20/20 1528]  Enc Vitals Group     BP 120/76     Pulse Rate 86     Resp 15     Temp 98.8 F (37.1 C)     Temp Source Oral     SpO2 98 %     Weight 150 lb (68 kg)     Height 5\' 10"  (1.778 m)     Head Circumference      Peak Flow      Pain Score 0     Pain Loc      Pain Edu?      Excl. in GC?     Constitutional: Alert and oriented. Well appearing and in no acute distress. Eyes: Conjunctivae are normal. PERRL. EOMI. Head: Atraumatic. Nose: No congestion/rhinnorhea. Mouth/Throat: Mucous membranes are moist.  Oropharynx  non-erythematous. Neck: No stridor.   Cardiovascular: Normal rate, regular rhythm. Grossly normal heart sounds.  Good peripheral circulation. Respiratory: Normal respiratory effort.  No retractions. Lungs CTAB. Musculoskeletal: There is no tenderness to palpation of the lumbar spine, no tenderness to palpation of the paraspinals of the lumbar region.  No step-off deformities.  There is no tenderness to palpation of the right lower extremity.  Full passive ankle range of motion, active range of motion limited in dorsiflexion and eversion secondary to weakness.  Dorsalis pedis pulse 1+ bilaterally, posterior tibial pulse 2+ bilaterally.  Normal color, normal capillary refill, patient able to move all toes. Neurologic:  Normal speech and language.  There is loss of sensation to the lateral aspect of the right lower extremity from the knee down to the foot.  The patient has 3/5 strength in dorsiflexion and eversion.  Patient able to wiggle all toes, 5/5 strength in ankle plantarflexion, inversion. Skin:  Skin is warm, dry and intact.  Psychiatric: Mood and affect are normal. Speech and behavior are normal.  ____________________________________________  RADIOLOGY  Official radiology report(s): US Venous Img Lower Unilateral Right  Result Date:  06/20/2020 CLINICAL DATA:  24 year old male with right lower extremity pain. EXAM: Right LOWER EXTREMITY VENOUS DOPPLER ULTRASOUND TECHNIQUE: Gray-scale sonography with compression, as well as color and duplex ultrasound, were performed to evaluate the deep venous system(s) from the level of the common femoral vein through the popliteal and proximal calf veins. COMPARISON:  None. FINDINGS: VENOUS Normal compressibility of the common femoral, superficial femoral, and popliteal veins, as well as the visualized calf veins. Visualized portions of profunda femoral vein and great saphenous vein unremarkable. No filling defects to suggest DVT on grayscale or color Doppler imaging. Doppler waveforms show normal direction of venous flow, normal respiratory plasticity and response to augmentation. Limited views of the contralateral common femoral vein are unremarkable. OTHER None. Limitations: none IMPRESSION: Negative. Electronically Signed   By: Elgie CollardArash  Radparvar M.D.   On: 06/20/2020 17:13    ____________________________________________   INITIAL IMPRESSION / ASSESSMENT AND PLAN / ED COURSE  As part of my medical decision making, I reviewed the following data within the electronic MEDICAL RECORD NUMBER Nursing notes reviewed and incorporated and Notes from prior ED visits        Patient is a 24 year old male who presents to the emergency department for evaluation of right lower extremity numbness.  Patient reports this began a few days ago.  Patient has a history of left upper extremity phlebitis secondary to heroin use.  Patient had previously stopped, but reports relapse 8 days ago and is concerned that this could be the cause of his symptoms.  See HPI for further details.  On physical exam, the patient does have a right foot drop with 3/5 strength in ankle dorsiflexion, eversion.  The patient also does not have normal sensation to the lateral aspect of the right lower extremity from the knee down.  Medial  sensation is normal.  these neurologic findings are focal, and there are no other abnormal neurologic findings to suggest the lumbar spine as the source of the patient's symptoms.  Patient does not have any changes in skin color, and vascularly intact.  Low suspicion for a vascular cause of the patient's symptoms, however given that this began with an episode of pain and his history of phlebitis, will rule out DVT of the right lower extremity.  Ultrasound Doppler study with negative.  The limb does not have a dusky appearance, there  is normal capillary refill and pulses are palpable making an arterial occlusion unlikely.  We will treat the patient's acute onset of foot drop with a prednisone taper and have the patient follow-up with podiatry.  Patient was counseled on importance of not using heroin, to which he agrees.  Return precautions were discussed with the patient, particularly to return for any changes in coloration to the right lower extremity, erythema or paleness, fevers or any other acute changes.  Patient is amenable with this plan and will  return as needed.      ____________________________________________   FINAL CLINICAL IMPRESSION(S) / ED DIAGNOSES  Final diagnoses:  Foot drop, right  Acute pain of right lower extremity     ED Discharge Orders         Ordered    predniSONE (DELTASONE) 10 MG tablet        06/20/20 1737          *Please note:  Akbar Sacra was evaluated in Emergency Department on 06/20/2020 for the symptoms described in the history of present illness. He was evaluated in the context of the global COVID-19 pandemic, which necessitated consideration that the patient might be at risk for infection with the SARS-CoV-2 virus that causes COVID-19. Institutional protocols and algorithms that pertain to the evaluation of patients at risk for COVID-19 are in a state of rapid change based on information released by regulatory bodies including the CDC and federal and  state organizations. These policies and algorithms were followed during the patient's care in the ED.  Some ED evaluations and interventions may be delayed as a result of limited staffing during and the pandemic.*   Note:  This document was prepared using Dragon voice recognition software and may include unintentional dictation errors.    Lucy Chris, PA 06/20/20 7371    Arnaldo Natal, MD 06/20/20 2351

## 2020-06-20 NOTE — Telephone Encounter (Signed)
Error see other phone note 06/20/20.

## 2020-06-21 ENCOUNTER — Telehealth: Payer: Self-pay | Admitting: Family Medicine

## 2020-06-21 NOTE — Telephone Encounter (Signed)
Pt mom called in he was peroneal nerve entrapment and they prescribed perindzone

## 2020-06-21 NOTE — Telephone Encounter (Signed)
Saw ER note and visit.   If not improving agree with consideration for podiatry but would also recommend f/u here if symptoms do not resolve.

## 2020-07-02 ENCOUNTER — Other Ambulatory Visit: Payer: Self-pay | Admitting: Primary Care

## 2020-08-07 ENCOUNTER — Ambulatory Visit (INDEPENDENT_AMBULATORY_CARE_PROVIDER_SITE_OTHER): Payer: No Typology Code available for payment source | Admitting: Internal Medicine

## 2020-08-07 ENCOUNTER — Other Ambulatory Visit: Payer: Self-pay

## 2020-08-07 ENCOUNTER — Encounter: Payer: Self-pay | Admitting: Internal Medicine

## 2020-08-07 VITALS — BP 126/84 | HR 115 | Temp 98.0°F | Ht 71.0 in | Wt 159.0 lb

## 2020-08-07 DIAGNOSIS — R768 Other specified abnormal immunological findings in serum: Secondary | ICD-10-CM

## 2020-08-07 DIAGNOSIS — Z7252 High risk homosexual behavior: Secondary | ICD-10-CM

## 2020-08-07 DIAGNOSIS — Z7253 High risk bisexual behavior: Secondary | ICD-10-CM | POA: Diagnosis not present

## 2020-08-07 MED ORDER — DESCOVY 200-25 MG PO TABS: 1.0000 | ORAL_TABLET | Freq: Every day | ORAL | 2 refills | Status: AC

## 2020-08-07 NOTE — Assessment & Plan Note (Signed)
He has successfully completed 12 weeks of Epclusa for Hep C without side effects or missed doses.  Will repeat his HCV RNA today and again in 12 weeks to ensure SVR.   Discussed the potential for reinfection if ongoing risk factors.

## 2020-08-07 NOTE — Progress Notes (Signed)
[facility]  CHIEF COMPLAINT:    Follow up for hepatitis C  SUBJECTIVE:    George Becker is a 25 y.o. male with PMHx as below who presents to the clinic for follow up of hepatitis C.   He recently completed 12 weeks of Epclusa for chronic hepatitis C.  He tolerated well with out any side effects.  He finished treatment around 07/30/20.  He also is prescribed Descovy for PrEP but has not been taking due to being on Epclusa.  He reports no new partners since HIV testing last done in June 2021.  Please see A&P for the details of today's visit and status of the patient's medical problems.   Patient's Medications  New Prescriptions   No medications on file  Previous Medications   ADVAIR DISKUS 500-50 MCG/DOSE AEPB    TAKE 1 PUFF BY MOUTH TWICE A DAY   ALBUTEROL (PROVENTIL) (2.5 MG/3ML) 0.083% NEBULIZER SOLUTION    Take 3 mLs (2.5 mg total) by nebulization every 8 (eight) hours as needed for wheezing or shortness of breath.   ALBUTEROL (VENTOLIN HFA) 108 (90 BASE) MCG/ACT INHALER    TAKE 2 PUFFS BY MOUTH EVERY 6 HOURS AS NEEDED FOR WHEEZE OR SHORTNESS OF BREATH   BUPRENORPHINE HCL-NALOXONE HCL (SUBOXONE SL)    Place under the tongue.   CETIRIZINE (ZYRTEC ALLERGY) 10 MG TABLET    Take 1 tablet (10 mg total) by mouth daily.   ESOMEPRAZOLE (NEXIUM) 20 MG PACKET    Take 20 mg by mouth daily as needed.    IBUPROFEN (ADVIL,MOTRIN) 200 MG TABLET    Take 600 mg by mouth every 6 (six) hours as needed for moderate pain.    MONTELUKAST (SINGULAIR) 10 MG TABLET    Take 1 tablet (10 mg total) by mouth at bedtime.   NALOXONE (NARCAN) 0.4 MG/ML INJECTION       NAPROXEN (NAPROSYN) 375 MG TABLET    Take 1 tablet (375 mg total) by mouth 2 (two) times daily with a meal.   PSEUDOEPHEDRINE (SUDAFED) 60 MG TABLET    Take 1 tablet (60 mg total) by mouth every 8 (eight) hours as needed for congestion.   TRIAMCINOLONE OINTMENT (KENALOG) 0.1 %    APPLY TO AFFECTED AREAS ON  DAILY UNTIL CLEAR, THEN NEEDED *MAX PER INSURANCE*   VALACYCLOVIR (VALTREX) 1000 MG TABLET    TAKE 1 TABLET (1,000 MG) BY MOUTH DAILY  Modified Medications   Modified Medication Previous Medication   EMTRICITABINE-TENOFOVIR AF (DESCOVY) 200-25 MG TABLET emtricitabine-tenofovir AF (DESCOVY) 200-25 MG tablet      Take 1 tablet by mouth daily.    Take 1 tablet by mouth daily.  Discontinued Medications   SOFOSBUVIR-VELPATASVIR (EPCLUSA) 400-100 MG TABS    Take 1 tablet by mouth daily.      Past Medical History:  Diagnosis Date  . Anxiety   . Asthma   . Clotting disorder (HCC)   . Substance abuse (HCC)     Social History   Tobacco Use  . Smoking status: Current Every Day Smoker    Packs/day: 1.00    Years: 5.00    Pack years: 5.00    Types: Cigarettes  . Smokeless tobacco: Never Used  . Tobacco comment: 1 to 2 ciggs a day  Vaping Use  . Vaping Use: Former  . Start date: 10/08/2019  Substance Use Topics  . Alcohol use: Yes    Alcohol/week: 0.0 standard drinks  Comment: socially   . Drug use: Not Currently    Types: Heroin, Methamphetamines, Marijuana    Comment: IV drugs    Family History  Problem Relation Age of Onset  . Diabetes Mother   . Sleep apnea Mother   . Hypertension Father   . Asthma Maternal Grandfather   . Congestive Heart Failure Maternal Grandfather   . Other Maternal Grandfather        hypersensitivity pneumonitis  . Diabetes Maternal Grandfather   . Hypertension Maternal Grandmother   . Lymphoma Maternal Grandmother   . Diabetes Paternal Grandmother   . Hyperlipidemia Paternal Grandfather   . Hypertension Paternal Grandfather     Allergies  Allergen Reactions  . Amoxicillin Anaphylaxis and Hives  . Augmentin [Amoxicillin-Pot Clavulanate] Anaphylaxis and Hives  . Other Anaphylaxis    "all Nuts"  . Peanut Butter Flavor   . Penicillins Anaphylaxis    Review of Systems  Constitutional: Negative.   Respiratory: Negative.   Cardiovascular:  Negative.   Gastrointestinal: Negative.      OBJECTIVE:    Vitals:   08/07/20 1036  BP: 126/84  Pulse: (!) 115  Temp: 98 F (36.7 C)  Weight: 159 lb (72.1 kg)  Height: 5\' 11"  (1.803 m)   Body mass index is 22.18 kg/m.  Physical Exam Constitutional:      General: He is not in acute distress.    Appearance: Normal appearance.  HENT:     Head: Normocephalic and atraumatic.  Pulmonary:     Effort: Pulmonary effort is normal. No respiratory distress.  Skin:    Coloration: Skin is not jaundiced.  Neurological:     General: No focal deficit present.     Mental Status: He is alert and oriented to person, place, and time.  Psychiatric:        Mood and Affect: Mood normal.        Behavior: Behavior normal.      Labs and Microbiology: CBC Latest Ref Rng & Units 08/14/2019 07/12/2019 09/06/2017  WBC 4.0 - 10.5 K/uL 6.4 6.4 13.9(H)  Hemoglobin 13.0 - 17.0 g/dL 11/06/2017 54.5 17.2(H)  Hematocrit 39.0 - 52.0 % 42.1 39.8 48.3  Platelets 150 - 400 K/uL 211 201.0 329   CMP Latest Ref Rng & Units 04/10/2020 12/08/2019 08/14/2019  Glucose 65 - 99 mg/dL 98 - 08/16/2019)  BUN 7 - 25 mg/dL 11 - 19  Creatinine 638(L - 1.35 mg/dL 3.73 - 4.28)  Sodium 135 - 146 mmol/L 138 - 139  Potassium 3.5 - 5.3 mmol/L 4.1 - 3.8  Chloride 98 - 110 mmol/L 99 - 100  CO2 20 - 32 mmol/L 34(H) - 31  Calcium 8.6 - 10.3 mg/dL 9.6 - 9.2  Total Protein 6.5 - 8.1 g/dL - - 6.9  Total Bilirubin 0.3 - 1.2 mg/dL - - 0.9  Alkaline Phos 38 - 126 U/L - - 55  AST 15 - 41 U/L - - 34  ALT 9 - 46 U/L - 58(H) 74(H)       ASSESSMENT & PLAN:    Positive hepatitis C antibody test He has successfully completed 12 weeks of Epclusa for Hep C without side effects or missed doses.  Will repeat his HCV RNA today and again in 12 weeks to ensure SVR.   Discussed the potential for reinfection if ongoing risk factors.   High risk bisexual behavior Previously prescribed Descovy for PrEP but did not initiate this due to being on Epclusa.   Previously HIV Ab/Ag screen  and RNA negative in June 2021.  Reports no current high risk exposure in the past 4 weeks.  Will repeat his HIV screen today and if okay will have him start Descovy with follow up in 3 months.    Orders Placed This Encounter  Procedures  . Hepatitis C RNA quantitative  . HIV Antibody (routine testing w rflx)      Vedia Coffer for Infectious Disease [facility] 08/07/2020, 11:02 AM

## 2020-08-07 NOTE — Assessment & Plan Note (Signed)
Previously prescribed Descovy for PrEP but did not initiate this due to being on Epclusa.  Previously HIV Ab/Ag screen and RNA negative in June 2021.  Reports no current high risk exposure in the past 4 weeks.  Will repeat his HIV screen today and if okay will have him start Descovy with follow up in 3 months.

## 2020-08-07 NOTE — Patient Instructions (Signed)
Thank you for coming to see me today. It was a pleasure seeing you.  To Do: [facility] Labs today . Follow up in 12 weeks . Okay to start Descovy for PrEP after I get your labs back  If you have any questions or concerns, please do not hesitate to call the office at [phone].  Take Care,   Gwynn Burly, DO

## 2020-08-09 LAB — HEPATITIS C RNA QUANTITATIVE
HCV Quantitative Log: <1.18 log IU/mL
HCV RNA, PCR, QN: <15 IU/mL

## 2020-08-09 LAB — HIV ANTIBODY (ROUTINE TESTING W REFLEX): HIV 1&2 Ab, 4th Generation: NONREACTIVE

## 2020-08-11 ENCOUNTER — Telehealth: Payer: Self-pay

## 2020-08-11 NOTE — Telephone Encounter (Signed)
Relayed test results to patient and reminded him of follow up appointment in May. No further questions/concerns   Rosanna Randy, RN

## 2020-08-11 NOTE — Telephone Encounter (Signed)
-----   Message from Kathlynn Grate, DO sent at 08/11/2020  9:54 AM EST ----- Please let patient know that his HCV RNA was negative and I will see him back again in 12 weeks for another test of cure.  His HIV was also negative so okay to resume PrEP.  Thanks! Greig Castilla

## 2020-09-03 ENCOUNTER — Other Ambulatory Visit: Payer: Self-pay | Admitting: Primary Care

## 2020-10-15 ENCOUNTER — Other Ambulatory Visit: Payer: Self-pay | Admitting: Primary Care

## 2020-10-25 ENCOUNTER — Telehealth: Payer: Self-pay | Admitting: Family Medicine

## 2020-10-25 NOTE — Telephone Encounter (Signed)
Mom called and stated that he passed on last October 26, 2022 and wanted to let Dr. Selena Batten know

## 2020-10-26 NOTE — Telephone Encounter (Signed)
Chart has been updated.

## 2020-10-26 NOTE — Telephone Encounter (Signed)
Called and offered condolences to [name], George Becker.

## 2020-10-26 NOTE — Telephone Encounter (Signed)
Attempted to call mother. Left voicemail.

## 2020-10-29 DEATH — deceased

## 2020-10-30 ENCOUNTER — Ambulatory Visit: Payer: No Typology Code available for payment source | Admitting: Internal Medicine
